# Patient Record
Sex: Female | Born: 1960 | Race: Black or African American | Hispanic: No | Marital: Married | Smoking: Never smoker
Health system: Southern US, Community
[De-identification: ages and names within clinical notes are randomized; demographics above are authoritative.]

## PROBLEM LIST (undated history)

## (undated) DIAGNOSIS — I10 Essential (primary) hypertension: Secondary | ICD-10-CM

## (undated) DIAGNOSIS — E119 Type 2 diabetes mellitus without complications: Secondary | ICD-10-CM

## (undated) HISTORY — DX: Type 2 diabetes mellitus without complications: E11.9

## (undated) HISTORY — DX: Essential (primary) hypertension: I10

## (undated) HISTORY — PX: FRACTURE SURGERY: SHX138

---

## 2009-03-26 ENCOUNTER — Ambulatory Visit: Payer: Self-pay | Admitting: Unknown Physician Specialty

## 2009-03-28 ENCOUNTER — Ambulatory Visit: Payer: Self-pay | Admitting: Unknown Physician Specialty

## 2014-08-02 ENCOUNTER — Encounter: Payer: Self-pay | Admitting: Podiatry

## 2014-08-02 ENCOUNTER — Ambulatory Visit (INDEPENDENT_AMBULATORY_CARE_PROVIDER_SITE_OTHER): Payer: BC Managed Care – PPO

## 2014-08-02 ENCOUNTER — Ambulatory Visit (INDEPENDENT_AMBULATORY_CARE_PROVIDER_SITE_OTHER): Payer: BC Managed Care – PPO | Admitting: Podiatry

## 2014-08-02 VITALS — BP 116/74 | HR 79 | Resp 12

## 2014-08-02 DIAGNOSIS — R52 Pain, unspecified: Secondary | ICD-10-CM

## 2014-08-02 DIAGNOSIS — Q6651 Congenital pes planus, right foot: Secondary | ICD-10-CM

## 2014-08-02 DIAGNOSIS — S92301A Fracture of unspecified metatarsal bone(s), right foot, initial encounter for closed fracture: Secondary | ICD-10-CM

## 2014-08-02 NOTE — Progress Notes (Signed)
   Subjective:    Patient ID: Lindsey Warren, female    DOB: 02/11/1961, 54 y.o.   MRN: [id number]  HPI  PT STATED INJURED RT FOOT LATERAL SIDE OF THE ANKLE 2 WEEKS AGO AND STILL SWOLLEN PAINFUL. RT ANKLE IS GETTING WORSE, SOMETIMES WHEN WALKING HAVE SHARP PAIN. TRIED NO TREATMENT.  Review of Systems  Constitutional: Positive for unexpected weight change.  All other systems reviewed and are negative.      Objective:   Physical Exam: I have reviewed her past medical history medications allergies surgery social history and review of systems. Pulses are strongly palpable bilateral. Neurologic sensorium is intact per Semmes-Weinstein monofilament. Deep tendon reflexes are intact bilateral and muscle strength was 5 over 5 dorsiflexion plantar flexors and inverters everters onto the musculature is intact. Orthopedic evaluation of a straight solid joints distal to the ankle for range of motion without crepitus the right foot appears to be much more flat and pronated foot type than the right. She also has pain and swelling overlying the anterolateral ankle as well as the fifth metatarsal base. Radiographic evaluation demonstrates a relatively normal ankle with severe pes planus. She does have what appears to be a stress reaction fifth metatarsal base of the right foot.        Assessment & Plan:  Assessment: Stress fracture fifth met base right.  Plan: The compression anklet for the swelling and placed her in a CAM walker and I will follow-up with her in 3-4 weeks for another set of x-rays. We may also discuss reconstruction of her right flat foot deformity.

## 2014-08-30 ENCOUNTER — Ambulatory Visit: Payer: BC Managed Care – PPO | Admitting: Podiatry

## 2014-09-04 ENCOUNTER — Encounter: Payer: Self-pay | Admitting: Podiatry

## 2014-09-04 ENCOUNTER — Ambulatory Visit (INDEPENDENT_AMBULATORY_CARE_PROVIDER_SITE_OTHER): Payer: BC Managed Care – PPO | Admitting: Podiatry

## 2014-09-04 ENCOUNTER — Ambulatory Visit (INDEPENDENT_AMBULATORY_CARE_PROVIDER_SITE_OTHER): Payer: BC Managed Care – PPO

## 2014-09-04 DIAGNOSIS — Q6651 Congenital pes planus, right foot: Secondary | ICD-10-CM | POA: Diagnosis not present

## 2014-09-04 DIAGNOSIS — S92301D Fracture of unspecified metatarsal bone(s), right foot, subsequent encounter for fracture with routine healing: Secondary | ICD-10-CM

## 2014-09-04 NOTE — Progress Notes (Signed)
She presents today for follow-up of her right fifth metatarsal fracture. She states that I still have pain over the fifth metatarsal and I have pulling of the back outside of the ankle. I would also like to talk about having my flatfoot repaired.  Objective: Vital signs are stable she is alert and oriented 3. Pulses are strongly palpable bilateral. Severe flatfoot deformity with calcaneal valgus and severe medial deviation of the talus with plantarflexion. Radiographic evaluation demonstrates severe flatfoot deformity with fracture fifth met base right. Chronic pain to the posterior lateral aspect of the foot. Full subtalar joint range of motion. When the subtalar joint is in line with the leg she has a flexible forefoot varus. Foot appears to be totally reducible. Ankle equinus is also noted and obviously it posterior tibial tendon dysfunction.  Assessment: Fracture fifth met base right non-comminuted. Severe flatfoot deformity Rule out coalition without a CT.  Plan: Request a CT of the right foot today and will discuss this with Dr. Jacqualyn Posey for surgical consideration.

## 2014-09-05 ENCOUNTER — Ambulatory Visit: Payer: Self-pay | Admitting: Podiatry

## 2014-09-08 ENCOUNTER — Encounter: Payer: Self-pay | Admitting: Podiatry

## 2014-09-12 ENCOUNTER — Ambulatory Visit (INDEPENDENT_AMBULATORY_CARE_PROVIDER_SITE_OTHER): Payer: BC Managed Care – PPO | Admitting: Podiatry

## 2014-09-12 DIAGNOSIS — R52 Pain, unspecified: Secondary | ICD-10-CM

## 2014-09-12 DIAGNOSIS — Q6651 Congenital pes planus, right foot: Secondary | ICD-10-CM

## 2014-09-14 NOTE — Progress Notes (Signed)
Subjective:     Patient ID: Lindsey Warren, female   DOB: 1960/07/20, 54 y.o.   MRN: [id number]  HPI 54 year old female presents the office today to discuss CT results of her right foot. She states that she has had pain to her right foot for several years and she is attended multiple conservative treatments including orthotics and bracing without any resolution. She states it is difficult for her to stand for prolonged periods of time and walk for prolonged distances without any discomfort. She is pretty treated for metatarsal fracture and ankle pain by Dr. Milinda Pointer. A CT scan was ordered for which she presents a discussed the results and discuss possible surgical intervention. No other complaints at this time in no acute changes since last appointment.  Review of Systems  All other systems reviewed and are negative.      Objective:   Physical Exam     Assessment:     AAO 3, NAD DP/PT pulses palpable, CRT less than 3 seconds Protective sensation intact with Simms Weinstein monofilament, vibratory sensation intact, Achilles tendon reflex intact. There is a significant decrease in medial arch height upon weightbearing with equinus present. There is transverse and frontal plane deformity as well. There is tenderness along the lateral aspect of the foot along the sinus tarsi and there is mild discomfort with subtalar joint range of motion. There is also tenderness overlying the dorsal aspect of the foot over the talonavicular joint. There is no pain or crepitation with ankle joint range of motion. There is no pain with MTPJ range of motion. There is no specific area pinpoint bony tenderness or pain with vibratory sensation. There is no overlying edema, erythema, increase in warmth. No open lesions or pre-ulcer lesions identified bilaterally. No pain with calf compression, swelling, warmth, erythema.    Plan:     54 year old female with symptomatic flatfoot deformity -CT scan results were  discussed the patient.  -I discussed the patient with conservative and surgical options. She states that this time she would proceed with surgical intervention and does not want a temporary fix. I discussed with the patient that since she has pain in the subtalar joint area and there is no findings on CT scan I recommended an injection into the area for both diagnostic and therapeutic purposes. She declined his injection. Risks versus difficult to evaluate at the actual subtalar joint is involved and they can determine surgical procedures however she declined. I discussed with the patient medial column fusion and possible and medial slide calcaneal osteotomy. I discussed the patient postoperative recovery time as well as competitions of the surgery. -She would have surgery the end of May. I recommend the patient think about her options and follow up in 2-3 weeks to schedule the surgery. In the meantime I'm going to obtain the actual copies of the CT scan to look at it in more detail to determine surgical procedure and I will discuss with Dr. Milinda Pointer.

## 2014-09-20 ENCOUNTER — Telehealth: Payer: Self-pay | Admitting: *Deleted

## 2014-09-20 NOTE — Telephone Encounter (Signed)
Pt states she was calling for the surgery date, Dr. Milinda Pointer and the other doctor had decided for her procedure.  Pt states she needs the confirmation for the employer.

## 2014-09-21 ENCOUNTER — Ambulatory Visit: Payer: BC Managed Care – PPO | Admitting: Podiatry

## 2014-09-21 NOTE — Telephone Encounter (Signed)
Jenny Reichmann called patient today. Per Dr. Jacqualyn Posey, he needs to talk to Dr Milinda Pointer first before scheduling her surgery.

## 2014-09-26 ENCOUNTER — Ambulatory Visit (INDEPENDENT_AMBULATORY_CARE_PROVIDER_SITE_OTHER): Payer: BC Managed Care – PPO | Admitting: Podiatry

## 2014-09-26 ENCOUNTER — Encounter: Payer: Self-pay | Admitting: Podiatry

## 2014-09-26 ENCOUNTER — Ambulatory Visit (INDEPENDENT_AMBULATORY_CARE_PROVIDER_SITE_OTHER): Payer: BC Managed Care – PPO

## 2014-09-26 DIAGNOSIS — M6789 Other specified disorders of synovium and tendon, multiple sites: Secondary | ICD-10-CM | POA: Diagnosis not present

## 2014-09-26 DIAGNOSIS — M19079 Primary osteoarthritis, unspecified ankle and foot: Secondary | ICD-10-CM | POA: Diagnosis not present

## 2014-09-26 DIAGNOSIS — M76829 Posterior tibial tendinitis, unspecified leg: Secondary | ICD-10-CM

## 2014-09-26 NOTE — Patient Instructions (Signed)
Pre-Operative Instructions  Congratulations, you have decided to take an important step to improving your quality of life.  You can be assured that the doctors of [facility] will be with you every step of the way.  1. Plan to be at the surgery center/hospital at least 1 (one) hour prior to your scheduled time unless otherwise directed by the surgical center/hospital staff.  You must have a responsible adult accompany you, remain during the surgery and drive you home.  Make sure you have directions to the surgical center/hospital and know how to get there on time. 2. For hospital based surgery you will need to obtain a history and physical form from your family physician within 1 month prior to the date of surgery- we will give you a form for you primary physician.  3. We make every effort to accommodate the date you request for surgery.  There are however, times where surgery dates or times have to be moved.  We will contact you as soon as possible if a change in schedule is required.   4. No Aspirin/Ibuprofen for one week before surgery.  If you are on aspirin, any non-steroidal anti-inflammatory medications (Mobic, Aleve, Ibuprofen) you should stop taking it 7 days prior to your surgery.  You make take Tylenol  For pain prior to surgery.  5. Medications- If you are taking daily heart and blood pressure medications, seizure, reflux, allergy, asthma, anxiety, pain or diabetes medications, make sure the surgery center/hospital is aware before the day of surgery so they may notify you which medications to take or avoid the day of surgery. 6. No food or drink after midnight the night before surgery unless directed otherwise by surgical center/hospital staff. 7. No alcoholic beverages 24 hours prior to surgery.  No smoking 24 hours prior to or 24 hours after surgery. 8. Wear loose pants or shorts- loose enough to fit over bandages, boots, and casts. 9. No slip on shoes, sneakers are best. 10. Bring  your boot with you to the surgery center/hospital.  Also bring crutches or a walker if your physician has prescribed it for you.  If you do not have this equipment, it will be provided for you after surgery. 11. If you have not been contracted by the surgery center/hospital by the day before your surgery, call to confirm the date and time of your surgery. 12. Leave-time from work may vary depending on the type of surgery you have.  Appropriate arrangements should be made prior to surgery with your employer. 13. Prescriptions will be provided immediately following surgery by your doctor.  Have these filled as soon as possible after surgery and take the medication as directed. 14. Remove nail polish on the operative foot. 15. Wash the night before surgery.  The night before surgery wash the foot and leg well with the antibacterial soap provided and water paying special attention to beneath the toenails and in between the toes.  Rinse thoroughly with water and dry well with a towel.  Perform this wash unless told not to do so by your physician.  Enclosed: 1 Ice pack (please put in freezer the night before surgery)   1 Hibiclens skin cleaner   Pre-op Instructions  If you have any questions regarding the instructions, do not hesitate to call our office.  [place]: [address] [place], [place] [address] [phone]  [place]: [address] [place], [place] [address] [phone]  [place]: [address]  [place],  [address] [phone]  Dr. Richard   Tuchman DPM, Dr. Norman Regal DPM Dr. Richard Sikora DPM, Dr. M. Todd Hyatt DPM, Dr. Kathryn Egerton DPM 

## 2014-09-27 ENCOUNTER — Other Ambulatory Visit: Payer: Self-pay | Admitting: Podiatry

## 2014-09-27 DIAGNOSIS — Z01818 Encounter for other preprocedural examination: Secondary | ICD-10-CM

## 2014-09-27 NOTE — Progress Notes (Signed)
Patient ID: Lindsey Warren, female   DOB: 1961/05/21, 54 y.o.   MRN: [id number]  Subjective: 54 year old female returns the office they discuss surgical options for symptom flatfoot deformity of the right foot. She states that she continues with ankle brace as needed for which she has to the majority of time. She has tried orthotics as well as shoe gear changes without any resolution. She has pain after prolonged ambulation and standing which is limiting her activities. She has no recent injury or trauma to the area. No other complaints at this time.  Objective: AAO 3, NAD DP/PT pulses palpable, CRT less than 3 seconds Protective sensation intact with Derrel Nip Monofilament, vibratory sensation intact, Achilles tendon reflex intact. There is a significant decrease in medial arch height upon weightbearing with equinus present on the right foot. Upon weightbearing the calcaneus does appear to be in slight valgus. There also does appear to be too many toe sign present and abduction of the forefoot. In gait she has prolonged pronation without any resupination. On nonweightbearing exam ankle joint range of motion and subtalar joint range of motion is intact. There is mild discomfort with subtalar joint range of motion there is no crepitation. There is tenderness overlying the dorsal aspect of the medial midfoot along the talonavicular joint. There is no specific tenderness at this time along the posterior tibial tendon on the insertion into the navicular. There is mild discomfort along the lateral aspect of the ankle on the course of the peroneal tendons however this is mild. There is no pain with eversion and the peroneals appear to be intact. MMT 5/5, ROM WNL No other areas of tenderness to bilateral lower extremities. No pain with calf compression, swelling, warmth, erythema.  Assessment: 54 year old female with symptomatic flatfoot deformity present for surgical  consultation.  Plan: -Previous x-rays and CT scans were again reviewed. The calcaneal axial view was also obtained of his appointment. -Patient this time to proceed with surgical invention. I discussed with her the proposed surgery which should be talonavicular and possible navicular cuneiform joint fusion. Also discussed her possible medial calcaneal slide osteotomy. Although the calcaneal axial view does appear to be in rectus alignment clinically she is in valgus. Also discussed her gastroc recession. I discussed with her the incision placement as well as the postoperative course in detail. I discussed with her this is major surgery to her foot and can take a long time to recover after surgery. I discussed the risks of surgery which include, but not limited to, infection, bleeding, swelling, chronic pain, need for further surgery, painful or ugly scar, numbness or sensation changes, over/under correction, reoccurrence, hardware failure, DVT/PE, loss of foot/leg, need for further joint fusions. Patient understands these risks and wishes to proceed with surgery. The surgical consent was reviewed with the patient not 3 pages were signed. No promises or guarantees were given the fact, the procedure and all questions were answered to the best of my ability. -I discussed with the patient a postoperative she will likely needs to be on DVT prophylaxis due to long-term nonweightbearing. -Prior to surgery will obtain CBC, BMP -Surgery will be performed of the [facility] specialty surgical center on May 27. -Prior to surgery patient was encouraged to call the office with any questions, concerns, change in symptoms.

## 2014-10-23 ENCOUNTER — Other Ambulatory Visit: Payer: Self-pay | Admitting: Podiatry

## 2014-10-23 LAB — CBC WITH DIFFERENTIAL/PLATELET
Basophils Absolute: 0 10*3/uL (ref 0.0–0.2)
Basos: 0 %
EOS (ABSOLUTE): 0 10*3/uL (ref 0.0–0.4)
Eos: 0 %
Hematocrit: 38.3 % (ref 34.0–46.6)
Hemoglobin: 13.4 g/dL (ref 11.1–15.9)
IMMATURE GRANS (ABS): 0 10*3/uL (ref 0.0–0.1)
IMMATURE GRANULOCYTES: 0 %
Lymphocytes Absolute: 1.8 10*3/uL (ref 0.7–3.1)
Lymphs: 35 %
MCH: 27.3 pg (ref 26.6–33.0)
MCHC: 35 g/dL (ref 31.5–35.7)
MCV: 78 fL — ABNORMAL LOW (ref 79–97)
MONOCYTES: 9 %
Monocytes Absolute: 0.4 10*3/uL (ref 0.1–0.9)
NEUTROS PCT: 56 %
Neutrophils Absolute: 2.8 10*3/uL (ref 1.4–7.0)
Platelets: 200 10*3/uL (ref 150–379)
RBC: 4.9 x10E6/uL (ref 3.77–5.28)
RDW: 13.2 % (ref 12.3–15.4)
WBC: 5.1 10*3/uL (ref 3.4–10.8)

## 2014-10-24 LAB — BMP8+EGFR
BUN/Creatinine Ratio: 20 (ref 9–23)
BUN: 15 mg/dL (ref 6–24)
CO2: 26 mmol/L (ref 18–29)
Calcium: 10 mg/dL (ref 8.7–10.2)
Chloride: 100 mmol/L (ref 97–108)
Creatinine, Ser: 0.75 mg/dL (ref 0.57–1.00)
GFR calc Af Amer: 105 mL/min/1.73 (ref >59)
GFR calc non Af Amer: 91 mL/min/1.73 (ref >59)
Glucose: 98 mg/dL (ref 65–99)
Potassium: 3.8 mmol/L (ref 3.5–5.2)
Sodium: 142 mmol/L (ref 134–144)

## 2014-11-08 ENCOUNTER — Telehealth: Payer: Self-pay | Admitting: *Deleted

## 2014-11-08 NOTE — Telephone Encounter (Signed)
"  Supposed to have foot surgery next Friday.  Is there anything I need to do?  I registered online and I had my bloodwork.  When will they call me to tell me what time to be there for surgery?"    I left her a message that there is nothing else to do other than remember not to eat or drink anything after midnight the night before surgery.  Someone will need to go with you because you will be under a local / IV sedation not able to drive.  The surgical center normally calls a day or 2 prior to surgery.  They will tell you the exact time to be there.  Please call if you have any further questions.

## 2014-11-17 DIAGNOSIS — M13879 Other specified arthritis, unspecified ankle and foot: Secondary | ICD-10-CM | POA: Diagnosis not present

## 2014-11-17 DIAGNOSIS — Q664 Congenital talipes calcaneovalgus: Secondary | ICD-10-CM | POA: Diagnosis not present

## 2014-11-17 DIAGNOSIS — M216X9 Other acquired deformities of unspecified foot: Secondary | ICD-10-CM | POA: Diagnosis not present

## 2014-11-22 ENCOUNTER — Ambulatory Visit (INDEPENDENT_AMBULATORY_CARE_PROVIDER_SITE_OTHER): Payer: BC Managed Care – PPO | Admitting: Podiatry

## 2014-11-22 ENCOUNTER — Ambulatory Visit (INDEPENDENT_AMBULATORY_CARE_PROVIDER_SITE_OTHER): Payer: BC Managed Care – PPO

## 2014-11-22 ENCOUNTER — Encounter: Payer: Self-pay | Admitting: Podiatry

## 2014-11-22 VITALS — BP 118/75 | HR 94 | Temp 98.2°F | Resp 16

## 2014-11-22 DIAGNOSIS — M19071 Primary osteoarthritis, right ankle and foot: Secondary | ICD-10-CM

## 2014-11-22 DIAGNOSIS — Z9889 Other specified postprocedural states: Secondary | ICD-10-CM

## 2014-11-22 NOTE — Progress Notes (Signed)
She presents today for follow-up of her flat foot repair right. Date of surgery 11/17/2014. Gastrocnemius muscle recession talonavicular fusion and a calcaneal slide osteotomy. She denies fever chills nausea vomiting muscle aches and pains. Continues Lovenox injections daily.  Objective: Vital signs are stable she's alert and oriented 3 presents on a knee scooter with cast of the right lower extremity which does not appear to be dirty. There is no malodor about the cast. Vital signs are stable she's alert and oriented 3. She has good range of motion of her toes which are sensitive to the touch. Radiographs confirm calcaneal osteotomy as well as talonavicular joint fusion. Screws remain in good position.  Assessment: Flatfoot repair right. No complications.  Plan: She will continue to be nonweightbearing with a knee scooter. She will keep the foot elevated and continue ice therapy. She will also continue her Lovenox therapy. She will follow up with Dr. Earleen Newport in 1 week. I have scheduled her for cast removal and application with staple removal.

## 2014-11-30 ENCOUNTER — Encounter: Payer: Self-pay | Admitting: Podiatry

## 2014-11-30 ENCOUNTER — Ambulatory Visit (INDEPENDENT_AMBULATORY_CARE_PROVIDER_SITE_OTHER): Payer: BC Managed Care – PPO | Admitting: Podiatry

## 2014-11-30 VITALS — BP 119/76 | HR 91 | Temp 98.0°F | Resp 16

## 2014-11-30 DIAGNOSIS — Z9889 Other specified postprocedural states: Secondary | ICD-10-CM

## 2014-11-30 DIAGNOSIS — B351 Tinea unguium: Secondary | ICD-10-CM

## 2014-11-30 MED ORDER — OXYCODONE-ACETAMINOPHEN 5-325 MG PO TABS: 1.0000 | ORAL_TABLET | Freq: Four times a day (QID) | ORAL | Status: DC | PRN

## 2014-12-04 NOTE — Progress Notes (Signed)
Patient ID: Lindsey Warren, female   DOB: [birth year], 54 y.o.   MRN: [id number]  DOS: 11/17/14 s/p Right gastroc recession, medial calcaneal slide osteotomy, talo-navicular joint arthrodesis;  Objective: 54 year old female presents the office today two-week status post right foot surgery. She states overall she is doing well and her pain is controlled. She's remaining nonweightbearing as directed with the use of a rolling knee scooter. She has been injecting Lovenox daily without any complications. She has no complaints this time. Denies any systemic complaints as fevers, chills, nausea, vomiting. Denies any calf pain, chest pain, soreness of breath. No other complaints at this time no acute changes since last appointment.  Objective: AAO 3, NAD DP/PT pulses palpable, CRT less than 3 seconds Protective sensation intact with Simms 1 2 monofilament Incision is on the medial mid calf, lateral calcaneus, dorsal medial midfoot, posterior heel are all well coapted without any evidence of dehiscence and sutures/staples are intact. There is no swelling erythema, ascending cellulitis, fluctuance, crepitus, drainage, purulence, malodor. There is mild edema around the surgical site. There is no significant tenderness to palpation along the surgical site at this time. No other areas of tenderness to bilateral lower extremities. No other open lesions or pre-ulcer lesions identified bilaterally. No pain with calf compression, swelling, warmth, erythema.  Assessment: 54 year old female 2 weeks status post right foot surgery, doing well  Plan: -Treatment options discussed including all alternatives, risks, and complications -Cast is removed. Sutures/staples removed without complications and the incisions remain coapted. Steri-Strips are placed over the incisions. Antibiotic when his place of the incisions followed by dry sterile dressing. Next the well-padded below-knee fiberglass cast was then applied making  sure to pad all bony prominences. -Continue nonweightbearing -Lovenox daily -Pain medication as needed -Ice to the area, continue elevation -Monitor for any signs or symptoms of infection and DVT/PE and directed to call the office immediately should any occur or go to the ER. -Follow up in 2 weeks for cast change or sooner if any problems are to arise. In the meantime I encouraged her to call the office with any questions, concerns, change in symptoms.

## 2014-12-12 ENCOUNTER — Ambulatory Visit (INDEPENDENT_AMBULATORY_CARE_PROVIDER_SITE_OTHER): Payer: BC Managed Care – PPO | Admitting: Podiatry

## 2014-12-12 VITALS — BP 110/75 | HR 90 | Resp 16

## 2014-12-12 DIAGNOSIS — Z9889 Other specified postprocedural states: Secondary | ICD-10-CM

## 2014-12-18 NOTE — Progress Notes (Signed)
Patient ID: Lindsey Warren, female   DOB: Dec 02, 1960, 54 y.o.   MRN: [id number]  DOS: 11/17/14 s/p Right gastroc recession, medial calcaneal slide osteotomy, talo-navicular joint arthrodesis;  Objective: [name] presents the office today 4-weeks status post right foot surgery for cast change. She states overall she is doing well and her pain is controlled. She's remaining nonweightbearing as directed with the use of a rolling knee scooter. She has been injecting Lovenox daily without any complications. She has no complaints this time. Denies any systemic complaints as fevers, chills, nausea, vomiting. Denies any calf pain, chest pain, soreness of breath. No other complaints at this time no acute changes since last appointment.  Objective: AAO 3, NAD DP/PT pulses palpable, CRT less than 3 seconds Protective sensation intact with Simms Weinstein monofilament Incision is on the medial mid calf, lateral calcaneus, dorsal medial midfoot, posterior heel are all well coapted without any evidence of dehiscence. There is no surrounding erythema, ascending cellulitis, fluctuance, crepitus, drainage, purulence, malodor. There is minimal edema around the surgical site. There is no significant tenderness to palpation along the surgical site at this time. No other areas of tenderness to bilateral lower extremities. No other open lesions or pre-ulcer lesions identified bilaterally. No pain with calf compression, swelling, warmth, erythema.  Assessment: 54 year old female 4 weeks status post right foot surgery, doing well  Plan: -Treatment options discussed including all alternatives, risks, and complications -Cast is removed.Antibiotic when his place of the incisions followed by dry sterile dressing. Next the well-padded below-knee fiberglass cast was then applied making sure to pad all bony prominences. -Continue nonweightbearing -Lovenox daily -Pain medication as needed -Ice to the area, continue  elevation -Monitor for any signs or symptoms of infection and DVT/PE and directed to call the office immediately should any occur or go to the ER. -Follow up in 2 weeks for cast change or sooner if any problems are to arise. In the meantime I encouraged her to call the office with any questions, concerns, change in symptoms. *X-ray next appointment.  Celesta Gentile, DPM

## 2015-01-02 ENCOUNTER — Ambulatory Visit (INDEPENDENT_AMBULATORY_CARE_PROVIDER_SITE_OTHER): Payer: BC Managed Care – PPO

## 2015-01-02 ENCOUNTER — Ambulatory Visit (INDEPENDENT_AMBULATORY_CARE_PROVIDER_SITE_OTHER): Payer: BC Managed Care – PPO | Admitting: Podiatry

## 2015-01-02 ENCOUNTER — Telehealth: Payer: Self-pay | Admitting: *Deleted

## 2015-01-02 VITALS — BP 123/87 | HR 88 | Resp 16

## 2015-01-02 DIAGNOSIS — Z9889 Other specified postprocedural states: Secondary | ICD-10-CM | POA: Diagnosis not present

## 2015-01-02 DIAGNOSIS — Z981 Arthrodesis status: Secondary | ICD-10-CM

## 2015-01-02 MED ORDER — OXYCODONE-ACETAMINOPHEN 5-325 MG PO TABS: 1.0000 | ORAL_TABLET | Freq: Four times a day (QID) | ORAL | Status: DC | PRN

## 2015-01-02 NOTE — Telephone Encounter (Signed)
Ok to fill per dr Jacqualyn Posey

## 2015-01-02 NOTE — Telephone Encounter (Signed)
Pt states she was seen today by Dr. Jacqualyn Posey as was to get a pain medication, please call in per pt.

## 2015-01-04 NOTE — Progress Notes (Signed)
Patient ID: Lindsey Warren, female   DOB: Oct 28, 1960, 54 y.o.   MRN: [id number]  DOS: 11/17/14 s/p Right gastroc recession, medial calcaneal slide osteotomy, talo-navicular joint arthrodesis;  Objective: Lindsey Warren presents the office today 6.5-weeks status post right foot surgery. She states overall she is doing well and her pain is controlled. She's remaining nonweightbearing as directed with the use of a rolling knee scooter. She has been injecting Lovenox daily without any complications. She has no complaints this time. Denies any systemic complaints as fevers, chills, nausea, vomiting. Denies any calf pain, chest pain, soreness of breath. No other complaints at this time no acute changes since last appointment.  Objective: AAO 3, NAD DP/PT pulses palpable, CRT less than 3 seconds Protective sensation intact with Simms Weinstein monofilament Cast clean, dry, intact. Incisions on the medial mid calf, lateral calcaneus, dorsal medial midfoot, posterior heel are all well coapted without any evidence of dehiscence. There is no surrounding erythema, ascending cellulitis, fluctuance, crepitus, drainage, purulence, malodor. There is minimal edema around the surgical site. There is no significant tenderness to palpation along the surgical sites at this time. The ankle feels "stiff" when trying to move it.  No other areas of tenderness to bilateral lower extremities. No other open lesions or pre-ulcer lesions identified bilaterally. No pain with calf compression, swelling, warmth, erythema.  Assessment: 54 year old female 6.5 weeks status post right foot surgery, doing well  Plan: -X-rays were obtained and reviewed with the patient.  -Treatment options discussed including all alternatives, risks, and complications -Cast is removed.Antibiotic when his place of the incisions followed by dry sterile dressing.  -At this time we will transition to a Cam Walker. However she should remain  nonweightbearing at all times. She can take the boot off to shower have her she should not apply any weight to her foot. She can also start gentle range of motion activity to the ankle. Discussed with her how to do this. -Lovenox daily -Pain medication as needed -Ice to the area, continue elevation -Monitor for any signs or symptoms of infection and DVT/PE and directed to call the office immediately should any occur or go to the ER. -Follow up in 3 weeks or sooner if any problems are to arise. In the meantime I encouraged her to call the office with any questions, concerns, change in symptoms. *X-ray next appointment.  Celesta Gentile, DPM

## 2015-01-23 ENCOUNTER — Ambulatory Visit (INDEPENDENT_AMBULATORY_CARE_PROVIDER_SITE_OTHER): Payer: BC Managed Care – PPO | Admitting: Podiatry

## 2015-01-23 ENCOUNTER — Ambulatory Visit (INDEPENDENT_AMBULATORY_CARE_PROVIDER_SITE_OTHER): Payer: BC Managed Care – PPO

## 2015-01-23 VITALS — BP 116/70 | HR 90 | Resp 16

## 2015-01-23 DIAGNOSIS — Z981 Arthrodesis status: Secondary | ICD-10-CM | POA: Diagnosis not present

## 2015-01-23 MED ORDER — OXYCODONE-ACETAMINOPHEN 5-325 MG PO TABS: 1.0000 | ORAL_TABLET | Freq: Four times a day (QID) | ORAL | Status: DC | PRN

## 2015-01-23 NOTE — Progress Notes (Signed)
Patient ID: Lindsey Warren, female   DOB: [birth year], 54 y.o.   MRN: [id number]  DOS: 11/17/14 s/p Right gastroc recession, medial calcaneal slide osteotomy, talo-navicular joint arthrodesis;  Objective: Lindsey Warren presents the office today 9-weeks status post right foot surgery. She states overall she is doing well and her pain is controlled. She states some pain medication intermittently at night if needed. She is asking for refill take if needed. She continues to be these the rolling knee scooter. Denies any systemic complaints as fevers, chills, nausea, vomiting. Denies any calf pain, chest pain, soreness of breath. No other complaints at this time no acute changes since last appointment.  Objective: AAO 3, NAD; presents in a CAM boot DP/PT pulses palpable, CRT less than 3 seconds Protective sensation intact with Simms Weinstein monofilament Incisions on the medial mid calf, lateral calcaneus, dorsal medial midfoot, posterior heel are all well coapted without any evidence of dehiscence and eschar has formed. There is no surrounding erythema, ascending cellulitis, fluctuance, crepitus, drainage, purulence, malodor. There is minimal edema around the surgical site. There is no tenderness to palpation along the surgical sites at this time.  No other areas of tenderness to bilateral lower extremities. No other open lesions or pre-ulcer lesions identified bilaterally. No pain with calf compression, swelling, warmth, erythema.  Assessment: 54 year old female 9 weeks status post right foot surgery, doing well  Plan: -X-rays were obtained and reviewed with the patient. Radial is in line still evident across the arthrodesis site. Hardware intact.  -Treatment options discussed including all alternatives, risks, and complications -She can start to put partial weightbearing on the right foot along she states the cam walker. Continue use of crutches. If there is any increase in pain returned and  nonweightbearing. -Pain medication as needed; refilled Percocet -Ice to the area, continue elevation -Monitor for any signs or symptoms of infection and DVT/PE and directed to call the office immediately should any occur or go to the ER. -Follow up in 4 weeks or sooner if any problems are to arise. In the meantime I encouraged her to call the office with any questions, concerns, change in symptoms. *If the next appointment there is not complete healing on x-ray we'll likely order bone submitted. *X-ray next appointment.  Celesta Gentile, DPM

## 2015-01-29 DIAGNOSIS — M79673 Pain in unspecified foot: Secondary | ICD-10-CM

## 2015-02-20 ENCOUNTER — Ambulatory Visit (INDEPENDENT_AMBULATORY_CARE_PROVIDER_SITE_OTHER): Payer: BC Managed Care – PPO | Admitting: Podiatry

## 2015-02-20 ENCOUNTER — Encounter: Payer: Self-pay | Admitting: Podiatry

## 2015-02-20 ENCOUNTER — Ambulatory Visit (INDEPENDENT_AMBULATORY_CARE_PROVIDER_SITE_OTHER): Payer: BC Managed Care – PPO

## 2015-02-20 VITALS — BP 106/67 | HR 71 | Resp 17

## 2015-02-20 DIAGNOSIS — S92909K Unspecified fracture of unspecified foot, subsequent encounter for fracture with nonunion: Secondary | ICD-10-CM

## 2015-02-20 DIAGNOSIS — Z981 Arthrodesis status: Secondary | ICD-10-CM

## 2015-02-20 DIAGNOSIS — S92901K Unspecified fracture of right foot, subsequent encounter for fracture with nonunion: Secondary | ICD-10-CM | POA: Diagnosis not present

## 2015-02-21 DIAGNOSIS — M79673 Pain in unspecified foot: Secondary | ICD-10-CM

## 2015-02-22 NOTE — Progress Notes (Addendum)
Patient ID: Lindsey Warren, female   DOB: 1961-04-28, 54 y.o.   MRN: [id number]  DOS: 11/17/14 s/p Right gastroc recession, medial calcaneal slide osteotomy, talo-navicular joint arthrodesis  Objective: Lindsey Warren presents the office today status post right foot surgery. She states overall she is doing well and her pain is controlled. She gets some occasional discomfort after being on her feet. She has started to put some weight down to her foot however she still uses the crutches for assistance and not painful weight. She is going to cam boot at all times. Denies any systemic complaints as fevers, chills, nausea, vomiting. Denies any calf pain, chest pain, soreness of breath. No other complaints at this time no acute changes since last appointment.  Objective: AAO 3, NAD; presents in a CAM boot DP/PT pulses palpable, CRT less than 3 seconds Protective sensation intact with Simms Weinstein monofilament Incisions on the medial mid calf, lateral calcaneus, dorsal medial midfoot, posterior heel are all well coapted and the scar is well formed. There is no surrounding erythema, ascending cellulitis, fluctuance, crepitus, drainage, purulence, malodor. There is trace edema around the surgical site. There there is no specific tenderness however there is some "discomfort" over the talonavicular surgical site. No other areas of tenderness to bilateral lower extremities. No other open lesions or pre-ulcer lesions identified bilaterally. No pain with calf compression, swelling, warmth, erythema.  Assessment: 54 year old female status post right foot surgery with nonunion.  Plan: -X-rays were obtained and reviewed with the patient. A radiolucent line is still evident at this time across the arthrodesis site. Fracture/arthrodesis gap 1-12mm. -Treatment options discussed including all alternatives, risks, and complications -She can start toweight-bear as tolerated in a CAM boot. Hopefully by putting some  pressure the foot will help to stimulate bone growth. -Ordered bone stimulator.  -Pain medication as needed -Ice to the area, continue elevation -Monitor for any signs or symptoms of infection and DVT/PE and directed to call the office immediately should any occur or go to the ER. -Follow up in 4 weeks or sooner if any problems are to arise. In the meantime I encouraged her to call the office with any questions, concerns, change in symptoms. *X-ray next appointment.  Celesta Gentile, DPM

## 2015-02-28 ENCOUNTER — Telehealth: Payer: Self-pay | Admitting: *Deleted

## 2015-02-28 NOTE — Telephone Encounter (Signed)
Pt states she left a form 1 week ago for Dr.Wagoner to complete and she was checking to see if it was ready, it needs to be returned before 03/02/2015/

## 2015-03-08 ENCOUNTER — Encounter: Payer: Self-pay | Admitting: Podiatry

## 2015-03-08 ENCOUNTER — Ambulatory Visit (INDEPENDENT_AMBULATORY_CARE_PROVIDER_SITE_OTHER): Payer: BC Managed Care – PPO

## 2015-03-08 ENCOUNTER — Telehealth: Payer: Self-pay | Admitting: *Deleted

## 2015-03-08 ENCOUNTER — Ambulatory Visit (INDEPENDENT_AMBULATORY_CARE_PROVIDER_SITE_OTHER): Payer: BC Managed Care – PPO | Admitting: Podiatry

## 2015-03-08 VITALS — BP 107/61 | HR 84 | Resp 18

## 2015-03-08 DIAGNOSIS — Z981 Arthrodesis status: Secondary | ICD-10-CM

## 2015-03-08 DIAGNOSIS — S92909K Unspecified fracture of unspecified foot, subsequent encounter for fracture with nonunion: Secondary | ICD-10-CM

## 2015-03-08 NOTE — Telephone Encounter (Signed)
I called the patient 03-06-15 and left a message that the disability paper was done and that we were closed on Friday and the patient is here in the office today and I asked patient if she got the call and patient stated that she did and would just pick the paperwork up when she came in and now has another form to fill out to take back to work. Lindsey Warren

## 2015-03-08 NOTE — Progress Notes (Signed)
Patient ID: Lindsey Warren, female   DOB: [birth year], 54 y.o.   MRN: [id number]  DOS: 11/17/14 s/p Right gastroc recession, medial calcaneal slide osteotomy, talo-navicular joint arthrodesis  Objective: [name] presents the office today status post right foot surgery. She states overall she is doing well and her pain is controlled. She has not been taking pain medicine. She has been ambulating in her cam boot and only minimal intermittent discomfort. She denies any increase in swelling or any redness. Denies any recent injury or trauma. She did the bone stim unit approximately one week ago and his been using that. No other complaints at this time in no acute changes. Denies any systemic complaints such as fevers, chills, nausea, vomiting. No calf pain, chest pain, shortness of breath.  Objective: AAO 3, NAD; presents in a CAM boot DP/PT pulses palpable, CRT less than 3 seconds Protective sensation intact with Simms Weinstein monofilament Incisions on the medial mid calf, lateral calcaneus, dorsal medial midfoot, posterior heel are all well coapted and the scar is well formed. There is no surrounding erythema, ascending cellulitis, fluctuance, crepitus, drainage, purulence, malodor. There is trace edema around the surgical site. There there is no tenderness  over the surgical sites.  Evaluation of the rear foot appears to be more rectus. There is an increase in medial arch height compared to preoperatively. No other areas of tenderness to bilateral lower extremities. No other open lesions or pre-ulcer lesions identified bilaterally. No pain with calf compression, swelling, warmth, erythema.  Assessment: 54 year old female status post right foot surgery with nonunion; with minimal discomfort.  Plan: -X-rays were obtained and reviewed with the patient.  -Treatment options discussed including all alternatives, risks, and complications -She can start to transition back into a regular shoe with  the use of an ankle brace which was dispensed to her today. -Continue bone submitted. -Pain medication as needed -Ice to the area, continue elevation -Monitor for any signs or symptoms of infection and DVT/PE and directed to call the office immediately should any occur or go to the ER. -Follow up in 4 weeks or sooner if any problems are to arise. In the meantime I encouraged her to call the office with any questions, concerns, change in symptoms. *X-ray next appointment.  Celesta Gentile, DPM

## 2015-03-15 ENCOUNTER — Encounter: Payer: Self-pay | Admitting: Podiatry

## 2015-03-15 DIAGNOSIS — M79673 Pain in unspecified foot: Secondary | ICD-10-CM

## 2015-03-26 DIAGNOSIS — M79673 Pain in unspecified foot: Secondary | ICD-10-CM

## 2015-04-05 ENCOUNTER — Encounter: Payer: Self-pay | Admitting: Podiatry

## 2015-04-05 ENCOUNTER — Ambulatory Visit (INDEPENDENT_AMBULATORY_CARE_PROVIDER_SITE_OTHER): Payer: BC Managed Care – PPO | Admitting: Podiatry

## 2015-04-05 ENCOUNTER — Ambulatory Visit (INDEPENDENT_AMBULATORY_CARE_PROVIDER_SITE_OTHER): Payer: BC Managed Care – PPO

## 2015-04-05 VITALS — BP 104/70 | HR 70 | Resp 18

## 2015-04-05 DIAGNOSIS — S92909K Unspecified fracture of unspecified foot, subsequent encounter for fracture with nonunion: Secondary | ICD-10-CM

## 2015-04-05 DIAGNOSIS — Z981 Arthrodesis status: Secondary | ICD-10-CM

## 2015-04-05 DIAGNOSIS — M19079 Primary osteoarthritis, unspecified ankle and foot: Secondary | ICD-10-CM | POA: Diagnosis not present

## 2015-04-06 ENCOUNTER — Telehealth: Payer: Self-pay | Admitting: *Deleted

## 2015-04-06 DIAGNOSIS — Z981 Arthrodesis status: Secondary | ICD-10-CM

## 2015-04-06 NOTE — Telephone Encounter (Addendum)
Pt states Dr. Jacqualyn Posey had discussed PT for her foot, and she has decided to accept.  Informed pt of Dr. Leigh Aurora orders and pt states she would like to remain in the [place] area and go to [facility].  Faxed PT orders to [phone].

## 2015-04-07 NOTE — Progress Notes (Signed)
Patient ID: Lindsey Warren, female   DOB: Aug 07, 1960, 54 y.o.   MRN: [id number]  DOS: 11/17/14 s/p Right gastroc recession, medial calcaneal slide osteotomy, talo-navicular joint arthrodesis  Objective: Lindsey Warren presents the office today status post right foot surgery. She states that she is doing well and she is slowly getting better. She states that she does feel that her pain is much improved compared to what it was prior to surgery however the pain that she's expansion now is different pain and is likely more due to the surgery recovering. She states that she is able to wear a regular shoe with an ankle brace and she does perform her daily activities as she was doing prior to surgery. She has not yet increased her activity to go back to walking/exercising but she can perform daily activities without any problems. She is also working at this time. She continues to use the bone stimulator. No other complaints at this time in no acute changes. Denies any systemic complaints such as fevers, chills, nausea, vomiting. No calf pain, chest pain, shortness of breath.  Objective: AAO 3, NAD; presents in a CAM boot DP/PT pulses palpable, CRT less than 3 seconds Protective sensation intact with Simms Weinstein monofilament Incisions on the medial mid calf, lateral calcaneus, dorsal medial midfoot, posterior heel are all well coapted and the scar is well formed. There is no surrounding erythema, ascending cellulitis, fluctuance, crepitus, drainage, purulence, malodor. There is trace edema around the surgical site. There is no specific tenderness to palpation along the surgical site at the bilateral feet.  No other areas of tenderness to bilateral lower extremities. No other open lesions or pre-ulcer lesions identified bilaterally. No pain with calf compression, swelling, warmth, erythema.  Assessment: 54 year old female status post right foot surgery  with improving symptoms.  Plan: -X-rays were  obtained and reviewed with the patient.  There does appear to be increased consolidation across the arthrodesis site. -Treatment options discussed including all alternatives, risks, and complications -Continue with regular supportive shoe gear. She can continue the ankle brace if needed. -Continue bone  stimulator -Pain medication as needed -Ice to the area, continue elevation -I discussed physical therapy however she wishes to hold off on this time. -Monitor for any signs or symptoms of infection and DVT/PE and directed to call the office immediately should any occur or go to the ER. -Follow up in 6 weeks or sooner if any problems are to arise. In the meantime I encouraged her to call the office with any questions, concerns, change in symptoms. *X-ray next appointment.  Celesta Gentile, DPM

## 2015-04-07 NOTE — Telephone Encounter (Signed)
If she lives in [place] she can do [facility] or if she can come to [place] then [facility]. We can mail her a prescription and she will need to call to make that appointment.

## 2015-05-15 ENCOUNTER — Encounter: Payer: BC Managed Care – PPO | Admitting: Podiatry

## 2015-05-22 ENCOUNTER — Ambulatory Visit (INDEPENDENT_AMBULATORY_CARE_PROVIDER_SITE_OTHER): Payer: BC Managed Care – PPO | Admitting: Podiatry

## 2015-05-22 ENCOUNTER — Encounter: Payer: Self-pay | Admitting: Podiatry

## 2015-05-22 ENCOUNTER — Ambulatory Visit (INDEPENDENT_AMBULATORY_CARE_PROVIDER_SITE_OTHER): Payer: BC Managed Care – PPO

## 2015-05-22 VITALS — BP 97/68 | HR 77 | Resp 18

## 2015-05-22 DIAGNOSIS — Z981 Arthrodesis status: Secondary | ICD-10-CM

## 2015-05-22 NOTE — Progress Notes (Signed)
Patient ID: Lindsey Warren, female   DOB: April 24, 1961, 54 y.o.   MRN: [id number]  DOS: 11/17/14 s/p Right gastroc recession, medial calcaneal slide osteotomy, talo-navicular joint arthrodesis  Objective: Lindsey Warren presents the office today status post right foot surgery. She states that she is doing well and she is continues to get better.  She states that she has been limited physical therapy which is helped. She does his pain is much improved compared to what was prior to surgery. The discomfort that she's experiencing now she states is different. She states that she has more stiffness in the morning when she first gets up but she gets going the results. She states over the last week she has been able to get them without any stiffness and she continues to improve. She denies any pain to the area but describes is that this is somewhat uncomfortable. She is back to work and doing daily activities. She does walk with a limp some this continues to improve. She is also continue bone similar.  No other complaints at this time in no acute changes. Denies any systemic complaints such as fevers, chills, nausea, vomiting. No calf pain, chest pain, shortness of breath.  Objective: AAO 3, NAD; presents in a CAM boot DP/PT pulses palpable, CRT less than 3 seconds Protective sensation intact with Simms Weinstein monofilament Incisions on the medial mid calf, lateral calcaneus, dorsal medial midfoot, posterior heel are all well coapted and the scar is well formed. There is no surrounding erythema, ascending cellulitis, fluctuance, crepitus, drainage, purulence, malodor. There is faint edema around the surgical site. There is no specific tenderness to palpation along the surgical site at the bilateral feet. Subtalar joint motion is limited.  No other areas of tenderness to bilateral lower extremities. No other open lesions or pre-ulcer lesions identified bilaterally. No pain with calf compression, swelling,  warmth, erythema.  Assessment: 54 year old female status post right foot surgery  with improving symptoms.  Plan: -X-rays were obtained and reviewed with the patient.  There does appear to be increased consolidation across the arthrodesis site. -Treatment options discussed including all alternatives, risks, and complications -Continue with regular supportive shoe gear. She can continue the ankle brace if needed bur she states she has been able to go without it.  -Continue bone  stimulator -Pain medication as needed -Ice to the area, continue elevation -Discussed orthotics. She was scanned for orthotics however before proceeding with him we will check with insurance completely. -Continue physical therapy. -Monitor for any signs or symptoms of infection and DVT/PE and directed to call the office immediately should any occur or go to the ER. -Follow up in 4 weeks or sooner if any problems are to arise. In the meantime I encouraged her to call the office with any questions, concerns, change in symptoms. *X-ray next appointment.  Celesta Gentile, DPM

## 2015-06-19 ENCOUNTER — Ambulatory Visit (INDEPENDENT_AMBULATORY_CARE_PROVIDER_SITE_OTHER): Payer: BC Managed Care – PPO

## 2015-06-19 ENCOUNTER — Ambulatory Visit (INDEPENDENT_AMBULATORY_CARE_PROVIDER_SITE_OTHER): Payer: BC Managed Care – PPO | Admitting: Podiatry

## 2015-06-19 ENCOUNTER — Encounter: Payer: Self-pay | Admitting: Podiatry

## 2015-06-19 VITALS — BP 106/66 | HR 76 | Resp 18

## 2015-06-19 DIAGNOSIS — Q669 Congenital deformity of feet, unspecified, unspecified foot: Secondary | ICD-10-CM

## 2015-06-19 DIAGNOSIS — M79671 Pain in right foot: Secondary | ICD-10-CM | POA: Diagnosis not present

## 2015-06-19 DIAGNOSIS — M779 Enthesopathy, unspecified: Secondary | ICD-10-CM

## 2015-06-19 DIAGNOSIS — Z981 Arthrodesis status: Secondary | ICD-10-CM

## 2015-06-19 NOTE — Progress Notes (Signed)
Patient ID: Lindsey Warren, female   DOB: June 27, 1960, 54 y.o.   MRN: [id number]  DOS: 11/17/14 s/p Right gastroc recession, medial calcaneal slide osteotomy, talo-navicular joint arthrodesis  Objective: Lindsey Warren presents the office today status post right foot surgery. She states that she feels that she is continuing to make progressive improvement. She says the pain that she was having prior to surgery has resolved of both the pain that she's experienced now as pain since the surgery. She states that she feels like she is getting cramping sensation to her foot particular the morning when she first gets up. She also would like to proceed with orthotics this time. Denies any recent injury or trauma. No other complaints at this time. Denies any systemic complaints such as fevers, chills, nausea, vomiting. No calf pain, chest pain, shortness of breath.  Objective: AAO 3, NAD; presents in a CAM boot DP/PT pulses palpable, CRT less than 3 seconds Protective sensation intact with Simms Weinstein monofilament Incisions on the medial mid calf, lateral calcaneus, dorsal medial midfoot, posterior heel are all well coapted and the scar is well formed. There is no surrounding erythema, ascending cellulitis, fluctuance, crepitus, drainage, purulence, malodor. There is faint edema around the surgical site. There is no specific tenderness to palpation along the surgical site at the bilateral feet at this time. Subtalar joint motion is limited. Subjectively she states the majority the pain in the morning when she first gets up after prolonged periods of activity. I was unable to elicit a specific area of tenderness at today's appointment. No other areas of tenderness to bilateral lower extremities. No other open lesions or pre-ulcer lesions identified bilaterally. No pain with calf compression, swelling, warmth, erythema.  Assessment: 54 year old female status post right foot surgery with intermittent foot  pain  Plan: -X-rays were obtained and reviewed with the patient.  There does appear to be increased consolidation across the arthrodesis site. -Treatment options discussed including all alternatives, risks, and complications -Night splint was dispensed. She does have some mild equinus residual that she's having a tightness sensation in her foot. This will help stretch. -She also did proceed with orthotics today. She was scanned for orthotics are sent to [facility] labs. -She can discontinue bone stimulator at this point. There is no pain along the surgical site of the arthrodesis. -Monitor for any signs or symptoms of infection and DVT/PE and directed to call the office immediately should any occur or go to the ER. -Follow up in 3 weeks to [facility] or sooner if any problems are to arise. In the meantime I encouraged her to call the office with any questions, concerns, change in symptoms.  Celesta Gentile, DPM

## 2015-06-26 ENCOUNTER — Telehealth: Payer: Self-pay | Admitting: *Deleted

## 2015-06-26 MED ORDER — TRAMADOL HCL 50 MG PO TABS: 50.0000 mg | ORAL_TABLET | Freq: Three times a day (TID) | ORAL | Status: DC | PRN

## 2015-06-26 NOTE — Telephone Encounter (Addendum)
Pt states she feels the weather is beginning to cause her foot to hurt at night and would like a pain medication.  Dr. Jacqualyn Posey ordered Tramadol 50mg , orders called to [facility] and pt.

## 2015-06-26 NOTE — Telephone Encounter (Signed)
Lets try tramadol 50mg  q8h prn pain disp #30. Thanks.

## 2015-07-19 ENCOUNTER — Encounter: Payer: BC Managed Care – PPO | Admitting: Podiatry

## 2015-07-19 ENCOUNTER — Ambulatory Visit (INDEPENDENT_AMBULATORY_CARE_PROVIDER_SITE_OTHER): Payer: BC Managed Care – PPO | Admitting: Podiatry

## 2015-07-19 ENCOUNTER — Encounter: Payer: Self-pay | Admitting: Podiatry

## 2015-07-19 DIAGNOSIS — Z981 Arthrodesis status: Secondary | ICD-10-CM

## 2015-07-19 NOTE — Patient Instructions (Signed)

## 2015-07-22 NOTE — Progress Notes (Signed)
Patient ID: Lindsey Warren, female   DOB: Jun 03, 1961, 55 y.o.   MRN: [id number]  Patient presents to pick up orthotics. Was seen by the MA. Oral and written break in instructions provided. Follow-up with me in 6 weeks or sooner if any issues are to arise. Call with any questions or concerns.

## 2015-08-30 ENCOUNTER — Ambulatory Visit (INDEPENDENT_AMBULATORY_CARE_PROVIDER_SITE_OTHER): Payer: BC Managed Care – PPO | Admitting: Podiatry

## 2015-08-30 ENCOUNTER — Encounter: Payer: Self-pay | Admitting: Podiatry

## 2015-08-30 ENCOUNTER — Ambulatory Visit (INDEPENDENT_AMBULATORY_CARE_PROVIDER_SITE_OTHER): Payer: BC Managed Care – PPO

## 2015-08-30 VITALS — BP 111/77 | HR 72 | Resp 18

## 2015-08-30 DIAGNOSIS — Z981 Arthrodesis status: Secondary | ICD-10-CM

## 2015-08-30 NOTE — Progress Notes (Signed)
Patient ID: JAIAH BATTERSHELL, female   DOB: 21-May-1961, 55 y.o.   MRN: [id number]  DOS: 11/17/14 s/p Right gastroc recession, medial calcaneal slide osteotomy, talo-navicular joint arthrodesis  Objective: Ms. Lemery presents the office today status post right foot surgery. Since last appointment she has been able to wear regular shoe and she's been wearing the orthotics. She states in the morning she still gets some stiffness. She states is not at been as bad as previously. She denies any specific pain to her foot however after being on her foot for quite some time she has had some discomfort. She has started to increase her walking around a track and she is up to 1 lateral that she states that she has not been pushing it and she has not had a time and she has been sick. No recent injury or trauma. No other complaints at this time.  Objective: AAO 3, NAD; presents in a CAM boot DP/PT pulses palpable, CRT less than 3 seconds Protective sensation intact with Simms Weinstein monofilament Incisions on the medial mid calf, lateral calcaneus, dorsal medial midfoot, posterior heel are all well coapted and the scar is well formed. There is no surrounding erythema, ascending cellulitis, fluctuance, crepitus, drainage, purulence, malodor. There is trace edema around the surgical site. There is no tenderness to palpation along the surgical site at the bilateral feet at this time. Subtalar joint motion is limited.  No other areas of tenderness to bilateral lower extremities. No other open lesions or pre-ulcer lesions identified bilaterally. No pain with calf compression, swelling, warmth, erythema.  Assessment: 55 year old female status post right foot surgery with intermittent foot pain  Plan: -X-rays were obtained and reviewed with the patient.  There does appear to be increased consolidation across the arthrodesis site. -Treatment options discussed including all alternatives, risks, and  complications -Continue supportive shoe gear and orthotics. She is increased activity and walking discussed that she may wear the ankle brace as needed. -Continue stretching exercises. -Monitor for any signs or symptoms of infection and DVT/PE and directed to call the office immediately should any occur or go to the ER. -Follow up in 2 months or sooner if any problems are to arise. In the meantime I encouraged her to call the office with any questions, concerns, change in symptoms.  Celesta Gentile, DPM

## 2015-11-01 ENCOUNTER — Ambulatory Visit (INDEPENDENT_AMBULATORY_CARE_PROVIDER_SITE_OTHER): Payer: BC Managed Care – PPO

## 2015-11-01 ENCOUNTER — Ambulatory Visit (INDEPENDENT_AMBULATORY_CARE_PROVIDER_SITE_OTHER): Payer: BC Managed Care – PPO | Admitting: Podiatry

## 2015-11-01 ENCOUNTER — Encounter: Payer: Self-pay | Admitting: Podiatry

## 2015-11-01 DIAGNOSIS — Z981 Arthrodesis status: Secondary | ICD-10-CM | POA: Diagnosis not present

## 2015-11-01 DIAGNOSIS — M779 Enthesopathy, unspecified: Secondary | ICD-10-CM | POA: Diagnosis not present

## 2015-11-01 DIAGNOSIS — R269 Unspecified abnormalities of gait and mobility: Secondary | ICD-10-CM

## 2015-11-04 NOTE — Progress Notes (Signed)
Patient ID: Lindsey Warren, female   DOB: 12-Feb-1961, 55 y.o.   MRN: [id number]  DOS: 11/17/14 s/p Right gastroc recession, medial calcaneal slide osteotomy, talo-navicular joint arthrodesis  Objective: Lindsey Warren presents the office today . Evaluation of right foot surgery. She said that she is doing well her only concern is the way she has been walking. She feels that she does not list her foot up in order to walk. She does that she has good strength when she is walking she is not walking and normal heel-to-toe gait. Lindsey Warren gets some occasional numbness and tingling to the back of her heel other than that the pain to the actual surgical sites is minimal. No recent injury or trauma. No other complaints at this time.  Objective: AAO 3, NAD; presents in a CAM boot DP/PT pulses palpable, CRT less than 3 seconds Protective sensation intact with Lindsey Warren monofilament Incisions on the medial mid calf, lateral calcaneus, dorsal medial midfoot, posterior heel are all well coapted and the scar is well formed. There is no surrounding erythema, ascending cellulitis, fluctuance, crepitus, drainage, purulence, malodor. There is no significant edema around the surgical site. There is no tenderness to palpation along the surgical site at the bilateral feet at this time. Subtalar joint motion is limited. There is some occasional subjective numbness and tingling to the posterior heel on the incision. During gait she does not have a normal heel to toe gait and she seems to have difficulty in dorsiflexion. She does have good strength in both dorsiflexion and plantarflexion nonweightbearing however. No other areas of tenderness to bilateral lower extremities. No other open lesions or pre-ulcer lesions identified bilaterally. No pain with calf compression, swelling, warmth, erythema.  Assessment: 55 year old female status post right foot surgery with gait changes  Plan: -X-rays were obtained and reviewed with  the patient.  There does appear to be increased consolidation across the arthrodesis site. -At this time I recommended her to return to physical therapy for gait training. I did prescribe this today and a prescription was provided today. -I ordered a neuropathy cream that she can use the posterior heel help with the nerve symptoms she is having. -Follow up as scheduled/or after PT or sooner if any problems are to arise. In the meantime I encouraged her to call the office with any questions, concerns, change in symptoms.  Lindsey Warren, DPM

## 2015-11-28 ENCOUNTER — Ambulatory Visit (INDEPENDENT_AMBULATORY_CARE_PROVIDER_SITE_OTHER): Payer: BC Managed Care – PPO | Admitting: Podiatry

## 2015-11-28 DIAGNOSIS — M779 Enthesopathy, unspecified: Secondary | ICD-10-CM

## 2015-12-07 NOTE — Progress Notes (Signed)
Betha evaluated pts complaint

## 2015-12-20 ENCOUNTER — Encounter: Payer: Self-pay | Admitting: Podiatry

## 2015-12-20 ENCOUNTER — Ambulatory Visit (INDEPENDENT_AMBULATORY_CARE_PROVIDER_SITE_OTHER): Payer: BC Managed Care – PPO | Admitting: Podiatry

## 2015-12-20 DIAGNOSIS — M79671 Pain in right foot: Secondary | ICD-10-CM | POA: Diagnosis not present

## 2015-12-20 DIAGNOSIS — Z981 Arthrodesis status: Secondary | ICD-10-CM

## 2015-12-20 NOTE — Progress Notes (Signed)
Patient ID: Lindsey Warren, female   DOB: 07-23-1960, 55 y.o.   MRN: [id number]  DOS: 11/17/14 s/p Right gastroc recession, medial calcaneal slide osteotomy, talo-navicular joint arthrodesis  Objective: Lindsey Warren presents the office today for follow-up evaluation of right foot surgery. She has gone to physical therapy and she has made good improvement. She presents today wearing a supportive sandal and doing well. She states that she has made good improvement. She gets some stiffness on the mornings when she gets up however overall she is improved. She is able to wear regular shoe and do well at work. No recent injury. No swelling or redness. She does that she does feel that she has improved compared to what it was prior to surgery. No other complaints at this time.  Objective: AAO 3, NAD; presents in a CAM boot DP/PT pulses palpable, CRT less than 3 seconds Protective sensation intact with Simms Weinstein monofilament Incisions on the medial mid calf, lateral calcaneus, dorsal medial midfoot, posterior heel are all well coapted and the scar is well formed. There is no surrounding erythema, ascending cellulitis, fluctuance, crepitus, drainage, purulence, malodor. There is no edema to the foot. There is no area of tenderness.  No other open lesions or pre-ulcer lesions identified bilaterally. No pain with calf compression, swelling, warmth, erythema.  Assessment: 55 year old female status post right foot surgery with improved symptoms.  Plan: -Treatment options discussed including all alternatives, risks, and complications -At this time continue with supportive shoes as well as orthotics. She recently did change her shoes and his been helping. -Continue home physical therapy. She feels that she is doing the same exercises home that she is able to do physical therapy. At this point we'll discontinue physical therapy and she agrees to this. She is doing well. I'll follow-up with her as needed  and she is discharged from surgery. I encouraged her to call the office of loosening questions, concerns or any change in symptoms.  Celesta Gentile, DPM

## 2016-01-22 ENCOUNTER — Ambulatory Visit (INDEPENDENT_AMBULATORY_CARE_PROVIDER_SITE_OTHER): Payer: BC Managed Care – PPO

## 2016-01-22 ENCOUNTER — Ambulatory Visit (INDEPENDENT_AMBULATORY_CARE_PROVIDER_SITE_OTHER): Payer: BC Managed Care – PPO | Admitting: Podiatry

## 2016-01-22 ENCOUNTER — Encounter: Payer: Self-pay | Admitting: Podiatry

## 2016-01-22 DIAGNOSIS — R52 Pain, unspecified: Secondary | ICD-10-CM

## 2016-01-22 DIAGNOSIS — S93401A Sprain of unspecified ligament of right ankle, initial encounter: Secondary | ICD-10-CM | POA: Diagnosis not present

## 2016-01-22 MED ORDER — MELOXICAM 15 MG PO TABS: 15.0000 mg | ORAL_TABLET | Freq: Every day | ORAL | 2 refills | Status: DC

## 2016-01-27 NOTE — Progress Notes (Signed)
Patient ID: Lindsey Warren, female   DOB: 1960-11-06, 55 y.o.   MRN: [id number]  DOS: 11/17/14 s/p Right gastroc recession, medial calcaneal slide osteotomy, talo-navicular joint arthrodesis  Objective: [name] presents the office today for concerns of right ankle pain. 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 Since then she has had pain to her ankle. She denies a pain to the surgical sites. She's had some swelling to the ankle but is his been somewhat improved. No redness or warmth. No other complaints at this time.  Objective: AAO 3, NAD; presents in a CAM boot DP/PT pulses palpable, CRT less than 3 seconds Protective sensation intact with Simms Weinstein monofilament Incision is well coapted and there is a scar overlying the area. There is no tenderness palpation of the surgical sites. There is tenderness along the right ankle to global of the ATFL to somewhat degree the CFL. Is no [place] PTF L or medial ankle ligaments. Ankle range of motion intact. Mild edema along the course the ATFL any erythema or increase in warmth. There is no gross ankle instability present. No proximal tib-fib pain No other open lesions or pre-ulcer lesions identified bilaterally. No pain with calf compression, swelling, warmth, erythema.  Assessment: 55 year old female right ankle sprain  Plan: -Treatment options discussed including all alternatives, risks, and complications -X-rays obtain reviewed. No evidence of acute fracture or stress fracture at this time. -This time recommended her to go back into her ankle brace, ice and elevation as well as anti-inflammatories. Also start physical therapy for ankle sprain rehabilitation. -Follow-up as scheduled or sooner if needed. -If symptoms worsen go into the CAM boot.  Celesta Gentile, DPM

## 2016-02-12 ENCOUNTER — Ambulatory Visit: Payer: BC Managed Care – PPO | Admitting: Podiatry

## 2016-02-28 ENCOUNTER — Ambulatory Visit (INDEPENDENT_AMBULATORY_CARE_PROVIDER_SITE_OTHER): Payer: BC Managed Care – PPO | Admitting: Podiatry

## 2016-02-28 ENCOUNTER — Encounter: Payer: Self-pay | Admitting: Podiatry

## 2016-02-28 ENCOUNTER — Ambulatory Visit (INDEPENDENT_AMBULATORY_CARE_PROVIDER_SITE_OTHER): Payer: BC Managed Care – PPO

## 2016-02-28 DIAGNOSIS — S93401A Sprain of unspecified ligament of right ankle, initial encounter: Secondary | ICD-10-CM

## 2016-02-28 DIAGNOSIS — R52 Pain, unspecified: Secondary | ICD-10-CM | POA: Diagnosis not present

## 2016-02-28 MED ORDER — METHYLPREDNISOLONE 4 MG PO TBPK: ORAL_TABLET | ORAL | 0 refills | Status: DC

## 2016-03-06 NOTE — Progress Notes (Signed)
Patient ID: Lindsey Warren, female   DOB: 27-Aug-1960, 55 y.o.   MRN: [id number]  Objective: Lindsey Warren presents the office today for concerns of right ankle pain. She says that it has gotten somewhat better but she is still having some pain status post ankle sprain. She is doing well from the surgical sites. She's been discharged from physical therapy. She has continue with home exercises. The swelling has improved. No redness or warmth or any sores. No other complaints at this time.  Objective: AAO 3, NAD; presents in a CAM boot DP/PT pulses palpable, CRT less than 3 seconds Protective sensation intact with Simms Weinstein monofilament Incision is well coapted and there is a scar overlying the area. There is no tenderness palpation of the surgical sites. There is continued decreased tenderness to palpation on the course the ATFL. There is no pillar course the CFL or PTF L today or along the medial ligaments or syndesmosis. No proximal tib-fib pain. No foot pain. No pain with ankle or subtalar joint range of motion. There is some trace edema along the lateral aspect of the ankle. No other open lesions or pre-ulcer lesions identified bilaterally. No pain with calf compression, swelling, warmth, erythema.  Assessment: 55 year old female right ankle sprain improved but still having some discomfort  Plan: -Treatment options discussed including all alternatives, risks, and complications -Discussed with her to continue her home range of motion, rehabilitation exercises. Ankle brace as needed for her to not wear this as possible. Continue ice and elevation holes anti-inflammatories. -Medrol dose pack, monitor blood sugar closely -Compression -Follow-up as scheduled or sooner if needed.  Lindsey Warren, DPM

## 2016-03-27 ENCOUNTER — Encounter: Payer: Self-pay | Admitting: Podiatry

## 2016-03-27 ENCOUNTER — Ambulatory Visit (INDEPENDENT_AMBULATORY_CARE_PROVIDER_SITE_OTHER): Payer: BC Managed Care – PPO | Admitting: Podiatry

## 2016-03-27 DIAGNOSIS — S93491S Sprain of other ligament of right ankle, sequela: Secondary | ICD-10-CM

## 2016-04-03 NOTE — Progress Notes (Signed)
Patient ID: Lindsey Warren, female   DOB: Oct 29, 1960, 55 y.o.   MRN: [id number]  Objective: Lindsey Warren presents the office today for follow-up evaluation of right ankle pain. She does that she has made some improvement compared to last appointment but she does continue to get some swelling. She is able to work and perform daily activities however as she is more active is when she has more pain. She has not been on physical therapy but she does continue with home exercises. She denies any redness or warmth to the area still swells intermittently to the outside aspect of her ankle. She states the surgical sites are doing well. No other complaints at this time.  Objective: AAO 3, NAD; presents in a CAM boot DP/PT pulses palpable, CRT less than 3 seconds Protective sensation intact with Simms Weinstein monofilament Incision is well coapted and there is a scar overlying the area. There is no tenderness palpation of the surgical sites. There is mild continued tenderness to palpation on the course the ATFL. There is no pain along the course of the CFL or PTF L today or along the medial ligaments or syndesmosis. No proximal tib-fib pain. No foot pain. No pain with ankle or subtalar joint range of motion. There is some trace edema along the lateral aspect of the ankle. No other open lesions or pre-ulcer lesions identified bilaterally. No pain with calf compression, swelling, warmth, erythema.  Assessment: 55 year old female right ankle sprain improving but does continue  Plan: -Treatment options discussed including all alternatives, risks, and complications -Discussed the repeat MRI but she wishes to hold off on that this time. Continue with home exercises as well as supportive shoe gear. Ankle brace if needed. She states that she feels that she is slowly making improvements and we'll continue with this treatment for now. She states that she has to off the surgical sites. We'll continue to monitor  closely. Follow-up next 4-6 weeks or sooner if needed. Call any questions or concerns meantime.  Celesta Gentile, DPM

## 2016-04-12 IMAGING — CT CT OF THE RIGHT FOOT WITHOUT CONTRAST
3 series · 11 of 33 positions shown, 13 images · non-contrast
Comparison: None.

CLINICAL DATA: Chronic flatfoot deformity. Preoperative planning
examination.

EXAM:
CT OF THE RIGHT FOOT WITHOUT CONTRAST
TECHNIQUE: Multidetector CT imaging of the right foot was performed according
to the standard protocol. Multiplanar CT image reconstructions were
also generated.

[Series 6: ax foot soft tissue 2.0 · axial · 0.22mm/px · z∈[+451,+591]mm · 3 of 125 slices shown, 4 images]
[im 29/125  soft-tissue]
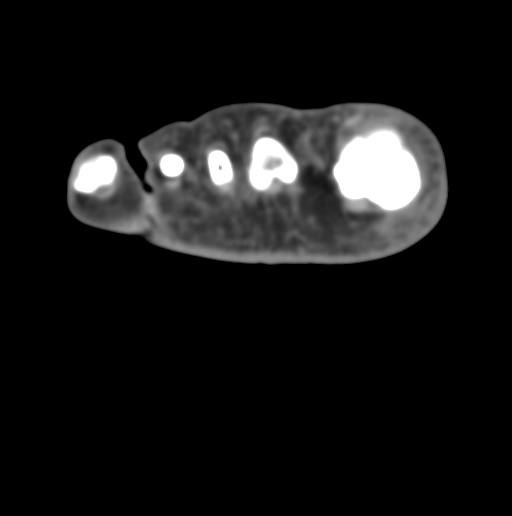
[im 29/125  bone]
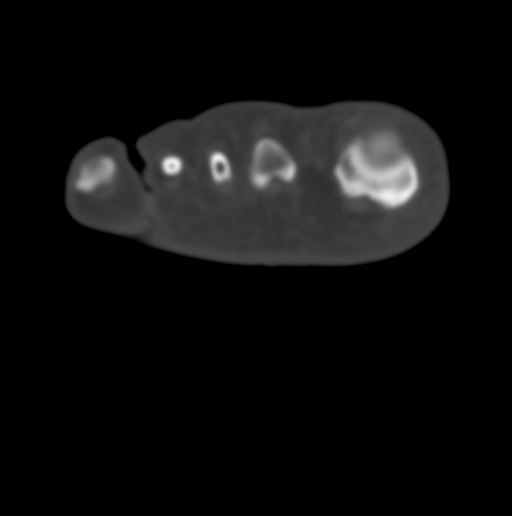
[im 67/125  bone]
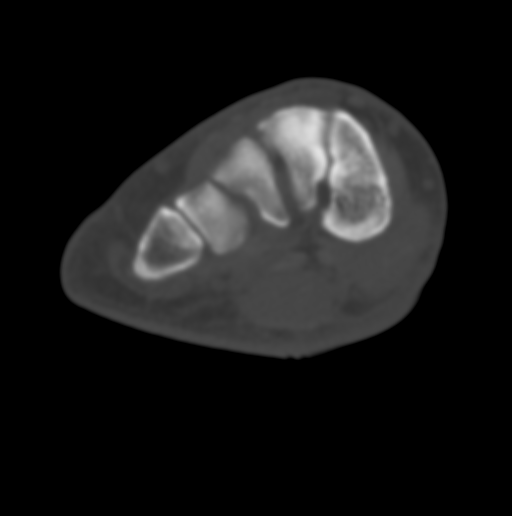
[im 105/125  bone]
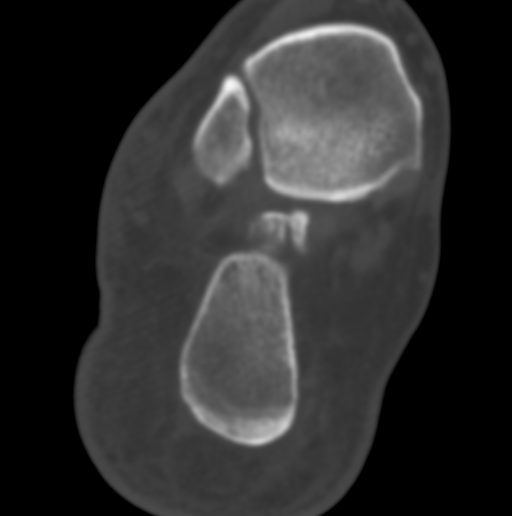

[Series 8: coronal foot st 2.0 · coronal · 0.20mm/px · 3 of 63 slices shown]
[im 14/63  bone]
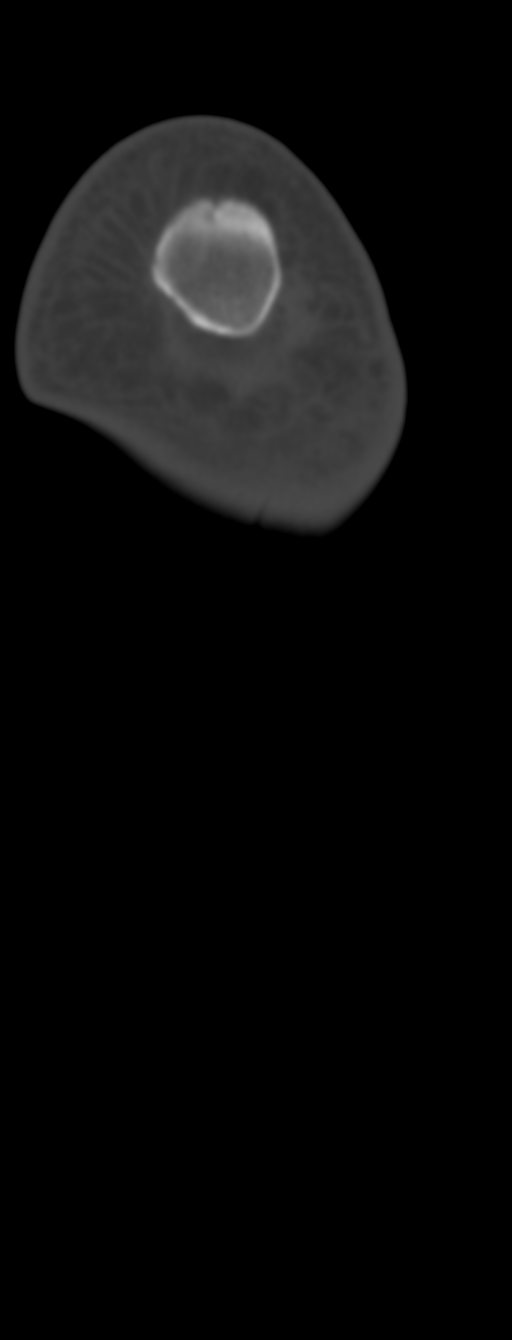
[im 26/63  bone]
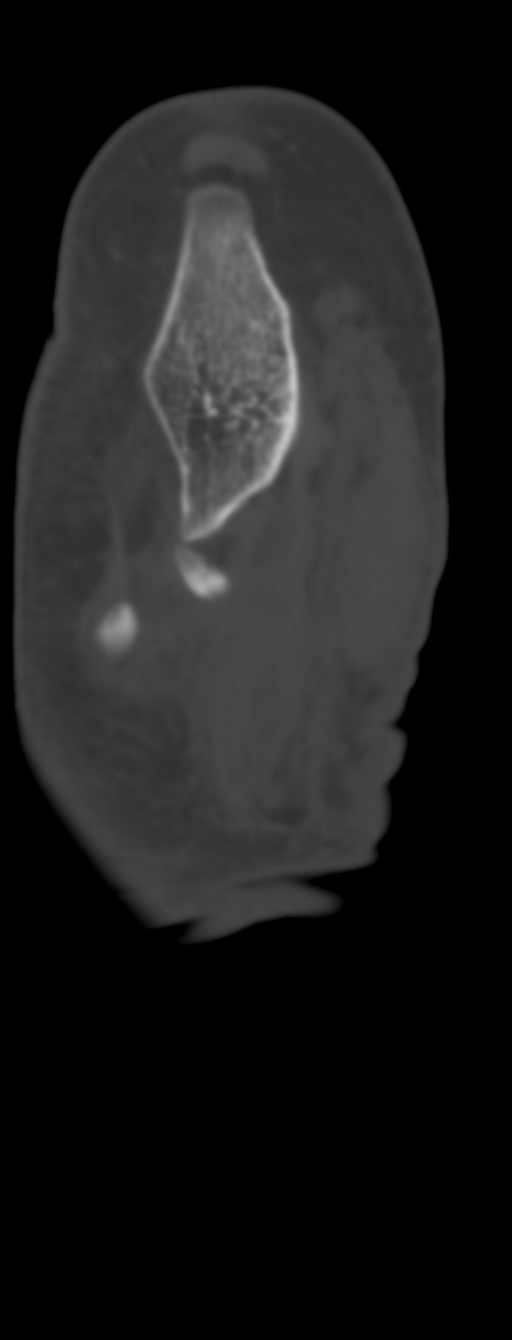
[im 38/63  bone]
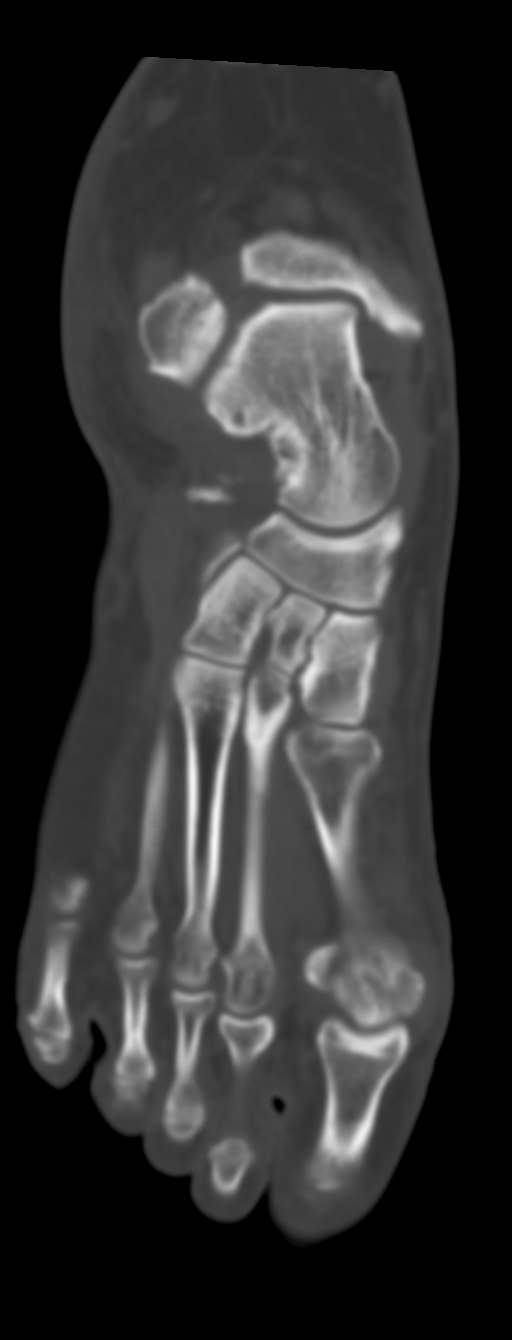

[Series 10: sagittal foot st 2.0 · sagittal · 0.32mm/px · 5 of 53 slices shown, 6 images]
[im 18/53  bone]
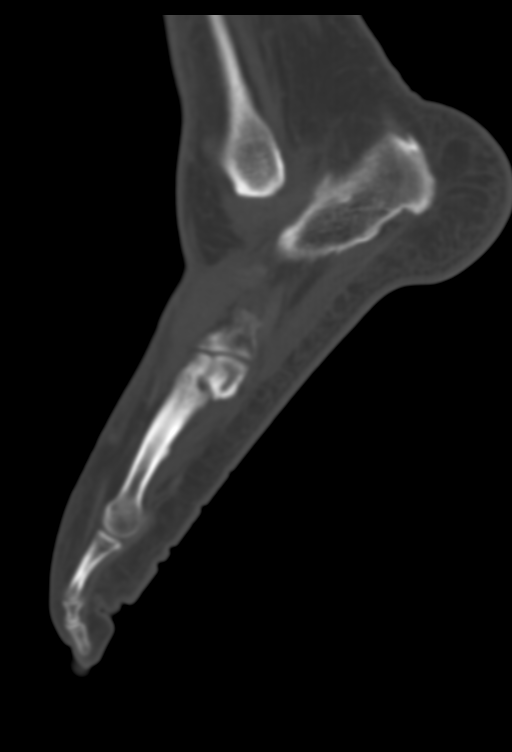
[im 22/53  bone]
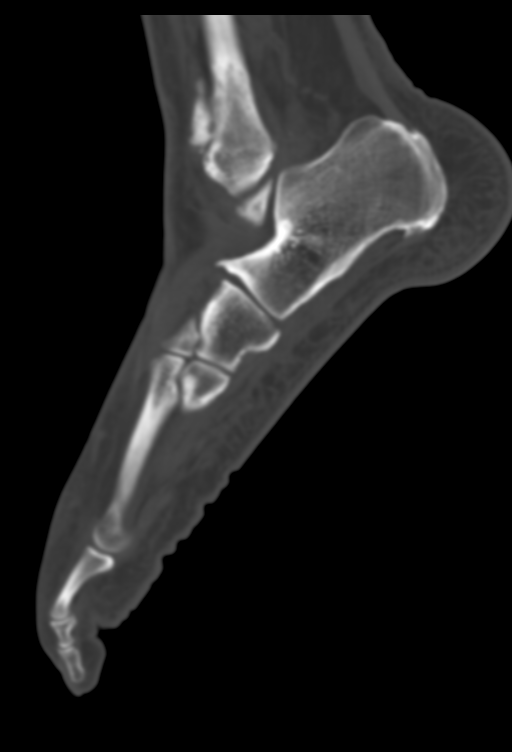
[im 27/53  soft-tissue]
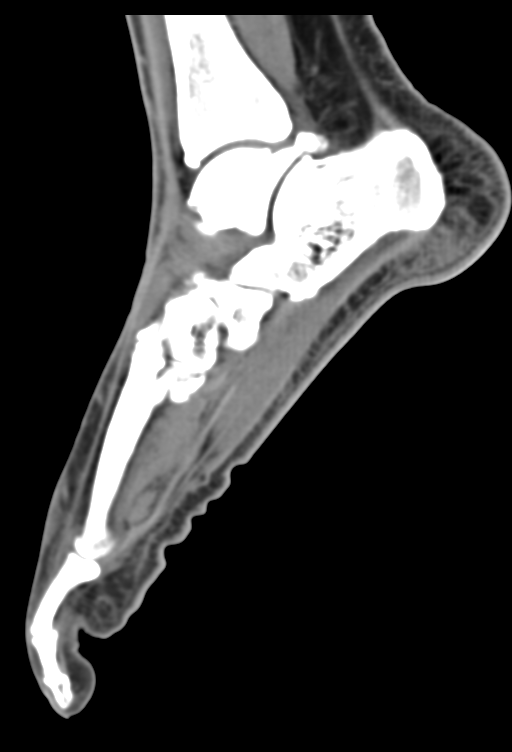
[im 27/53  bone]
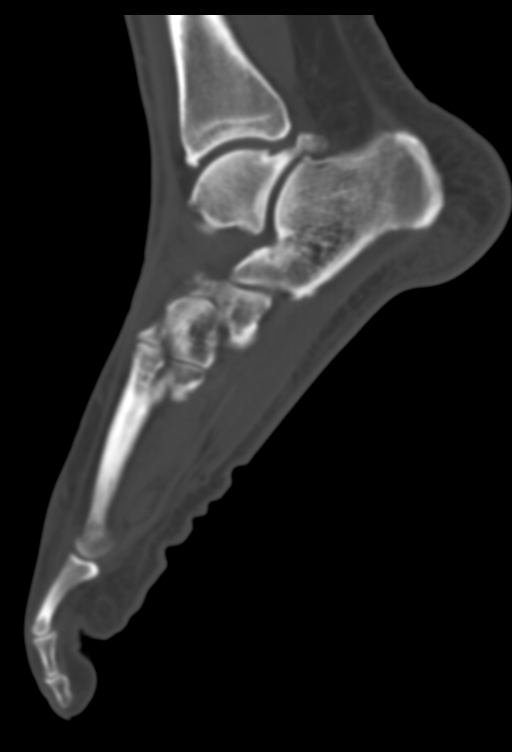
[im 31/53  bone]
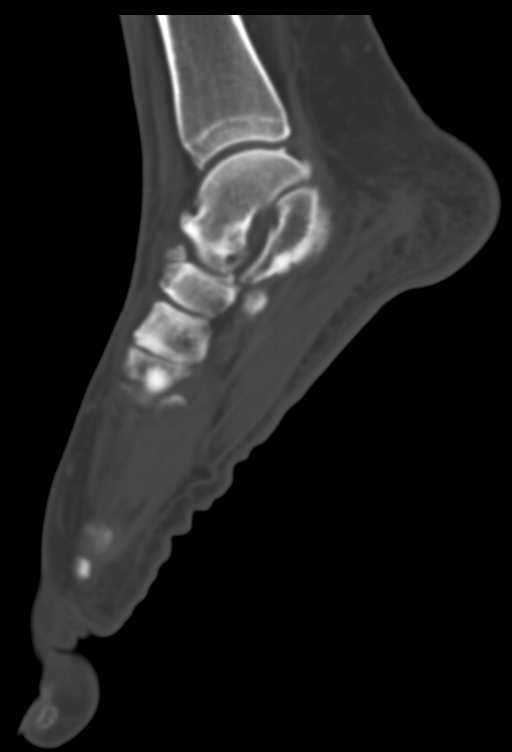
[im 35/53  bone]
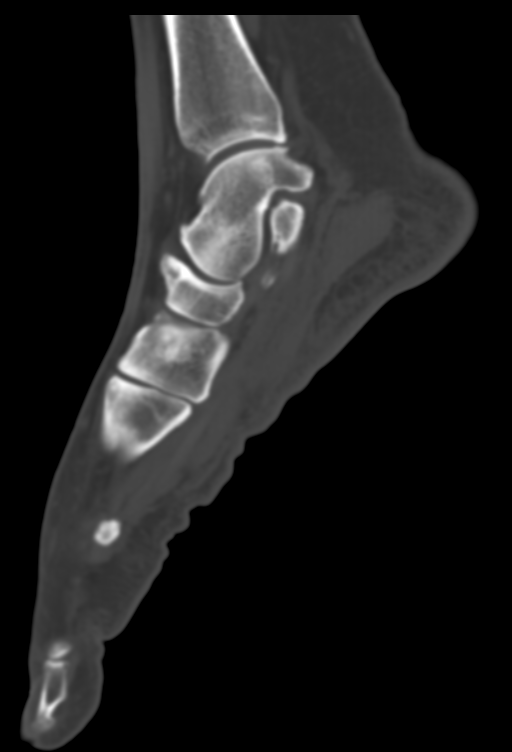

[11 of 33 positions shown; findings below may reference images not displayed]

FINDINGS: Pes planus deformity is identified. No acute bony or joint
abnormality is seen. Degenerative change about the foot is most
notable at the talonavicular joint. There is no tarsal coalition. Os
trigonum is noted. Small plantar calcaneal spur is also seen. No
focal bony lesion is present. No fracture is identified.
IMPRESSION: No acute finding.

Pes planus.

Osteoarthritis most notable about the talonavicular joint.

## 2017-09-02 ENCOUNTER — Encounter: Payer: Self-pay | Admitting: *Deleted

## 2017-09-02 ENCOUNTER — Other Ambulatory Visit: Payer: Self-pay

## 2017-09-02 ENCOUNTER — Ambulatory Visit
Admission: EM | Admit: 2017-09-02 | Discharge: 2017-09-02 | Disposition: A | Discharge status: Home / Self Care | Payer: BC Managed Care – PPO | Attending: Family Medicine | Admitting: Family Medicine

## 2017-09-02 DIAGNOSIS — H6503 Acute serous otitis media, bilateral: Secondary | ICD-10-CM | POA: Diagnosis not present

## 2017-09-02 MED ORDER — AMOXICILLIN 875 MG PO TABS: 875.0000 mg | ORAL_TABLET | Freq: Two times a day (BID) | ORAL | 0 refills | Status: DC

## 2017-09-02 NOTE — ED Provider Notes (Signed)
[facility]    CSN: [id number] Arrival date & time: 09/02/17  1432     History   Chief Complaint Chief Complaint  Patient presents with  . Ear Fullness    HPI Lindsey Warren is a 57 y.o. female.   The history is provided by the patient.  Ear Fullness   URI  Presenting symptoms: congestion, ear pain and rhinorrhea   Severity:  Moderate Onset quality:  Sudden Duration:  1 week Timing:  Constant Progression:  Worsening Chronicity:  New Relieved by:  None tried Ineffective treatments:  None tried Associated symptoms: no wheezing   Risk factors: diabetes mellitus and sick contacts   Risk factors: not elderly, no chronic cardiac disease, no chronic kidney disease, no chronic respiratory disease, no immunosuppression, no recent illness and no recent travel     Past Medical History:  Diagnosis Date  . Diabetes mellitus without complication ([place])   . Hypertension     There are no active problems to display for this patient.   History reviewed. No pertinent surgical history.  OB History    No data available       Home Medications    Prior to Admission medications   Medication Sig Start Date End Date Taking? Authorizing Provider  amLODipine (NORVASC) 10 MG tablet take 1 tablet by mouth once daily 03/13/14  Yes [provider]  gabapentin (NEURONTIN) 300 MG capsule take 1 capsule by mouth at bedtime 06/10/14  Yes [provider]  lisinopril-hydrochlorothiazide (PRINZIDE,ZESTORETIC) 20-25 MG per tablet Take by mouth. 09/14/14  Yes [provider]  metFORMIN (GLUCOPHAGE-XR) 500 MG 24 hr tablet take 1 tablet by mouth once daily every morning BEFORE BREAKFAST 09/08/14  Yes [provider]  amoxicillin (AMOXIL) 875 MG tablet Take 1 tablet (875 mg total) by mouth 2 (two) times daily. 09/02/17   Norval Gable, MD  meloxicam (MOBIC) 15 MG tablet Take 1 tablet (15 mg total) by mouth daily. 01/22/16   Trula Slade, DPM    methylPREDNISolone (MEDROL DOSEPAK) 4 MG TBPK tablet Take as directed 02/28/16   Trula Slade, DPM  oxyCODONE-acetaminophen (ROXICET) 5-325 MG per tablet Take 1-2 tablets by mouth every 6 (six) hours as needed for severe pain. 01/23/15   Trula Slade, DPM  traMADol (ULTRAM) 50 MG tablet Take 1 tablet (50 mg total) by mouth every 8 (eight) hours as needed. 06/26/15   Trula Slade, DPM    Family History Family History  Problem Relation Age of Onset  . Hypertension Mother   . Diabetes Mother   . Diabetes Father   . Hypertension Father     Social History Social History   Tobacco Use  . Smoking status: Never Smoker  . Smokeless tobacco: Never Used  Substance Use Topics  . Alcohol use: No  . Drug use: No     Allergies   Patient has no known allergies.   Review of Systems Review of Systems  HENT: Positive for congestion, ear pain and rhinorrhea.   Respiratory: Negative for wheezing.      Physical Exam Triage Vital Signs ED Triage Vitals  Enc Vitals Group     BP 09/02/17 1447 126/90     Pulse Rate 09/02/17 1447 (!) 103     Resp 09/02/17 1447 16     Temp 09/02/17 1447 98.1 F (36.7 C)     Temp src --      SpO2 09/02/17 1447 100 %     Weight  09/02/17 1448 210 lb (95.3 kg)     Height 09/02/17 1448 5\' 6"  (1.676 m)     Head Circumference --      Peak Flow --      Pain Score 09/02/17 1447 0     Pain Loc --      Pain Edu? --      Excl. in [place]? --    No data found.  Updated Vital Signs BP 126/90 (BP Location: Left Arm)   Pulse (!) 103   Temp 98.1 F (36.7 C)   Resp 16   Ht 5\' 6"  (1.676 m)   Wt 210 lb (95.3 kg)   LMP  (LMP Unknown)   SpO2 100%   BMI 33.89 kg/m   Visual Acuity Right Eye Distance:   Left Eye Distance:   Bilateral Distance:    Right Eye Near:   Left Eye Near:    Bilateral Near:     Physical Exam  Constitutional: She appears well-developed and well-nourished.  Non-toxic appearance. She does not have a sickly appearance. No  distress.  HENT:  Head: Normocephalic and atraumatic.  Right Ear: External ear and ear canal normal. Tympanic membrane is erythematous and bulging. A middle ear effusion is present.  Left Ear: External ear and ear canal normal. Tympanic membrane is erythematous and bulging. A middle ear effusion is present.  Nose: Mucosal edema and rhinorrhea present. No nose lacerations, sinus tenderness, nasal deformity, septal deviation or nasal septal hematoma. No epistaxis.  No foreign bodies. Right sinus exhibits no maxillary sinus tenderness and no frontal sinus tenderness. Left sinus exhibits no maxillary sinus tenderness and no frontal sinus tenderness.  Mouth/Throat: Uvula is midline, oropharynx is clear and moist and mucous membranes are normal. No oropharyngeal exudate.  Eyes: Conjunctivae and EOM are normal. Pupils are equal, round, and reactive to light. Right eye exhibits no discharge. Left eye exhibits no discharge. No scleral icterus.  Neck: Normal range of motion. Neck supple. No thyromegaly present.  Cardiovascular: Normal rate, regular rhythm and normal heart sounds.  Pulmonary/Chest: Effort normal and breath sounds normal. No respiratory distress. She has no wheezes. She has no rales.  Lymphadenopathy:    She has no cervical adenopathy.  Skin: She is not diaphoretic.  Nursing note and vitals reviewed.    UC Treatments / Results  Labs (all labs ordered are listed, but only abnormal results are displayed) Labs Reviewed - No data to display  EKG  EKG Interpretation None       Radiology No results found.  Procedures Procedures (including critical care time)  Medications Ordered in UC Medications - No data to display   Initial Impression / Assessment and Plan / UC Course  I have reviewed the triage vital signs and the nursing notes.  Pertinent labs & imaging results that were available during my care of the patient were reviewed by me and considered in my medical decision  making (see chart for details).       Final Clinical Impressions(s) / UC Diagnoses   Final diagnoses:  Bilateral acute serous otitis media, recurrence not specified    ED Discharge Orders        Ordered    amoxicillin (AMOXIL) 875 MG tablet  2 times daily     09/02/17 1518     1. diagnosis reviewed with patient 2. rx as per orders above; reviewed possible side effects, interactions, risks and benefits  3. Recommend supportive treatment with rest, fluids, otc analgesics prn 4. Follow-up  prn if symptoms worsen or don't improve  Controlled Substance Prescriptions [place] Controlled Substance Registry consulted? Not Applicable   Norval Gable, MD 09/02/17 1530

## 2017-09-02 NOTE — ED Triage Notes (Signed)
PAtient started having bilateral ear fullness 1 week ago. Patient reports everything sounds muffled.

## 2018-02-06 ENCOUNTER — Other Ambulatory Visit: Payer: Self-pay

## 2018-02-06 ENCOUNTER — Ambulatory Visit
Admission: EM | Admit: 2018-02-06 | Discharge: 2018-02-06 | Disposition: A | Discharge status: Home / Self Care | Payer: BC Managed Care – PPO | Attending: Family | Admitting: Family

## 2018-02-06 ENCOUNTER — Encounter: Payer: Self-pay | Admitting: Emergency Medicine

## 2018-02-06 DIAGNOSIS — H6993 Unspecified Eustachian tube disorder, bilateral: Secondary | ICD-10-CM | POA: Diagnosis not present

## 2018-02-06 DIAGNOSIS — H9203 Otalgia, bilateral: Secondary | ICD-10-CM | POA: Diagnosis not present

## 2018-02-06 MED ORDER — PREDNISONE 10 MG (21) PO TBPK: ORAL_TABLET | ORAL | 0 refills | Status: DC

## 2018-02-06 NOTE — ED Provider Notes (Signed)
[facility]    CSN: [id number] Arrival date & time: 02/06/18  1048     History   Chief Complaint Chief Complaint  Patient presents with  . Ear Fullness    HPI Lindsey Warren is a 57 y.o. female.   57 year old female presents with bilateral ear fullness and discomfort for the past 2 to 3 days. Feels "off balance" and slightly dizzy. Denies any fever, sore throat, cough or GI symptoms. Is having some nasal congestion. Has tried Flonase and Zyrtec for symptoms with no relief. No recent airplane trips or change in altitude but has traveled by car from [place]. Has history of recent ear infection in March 2019 here at [facility] and was placed on antibiotics and Prednisone with success. Other chronic health issues include HTN, Diabetes, and insomnia in which she takes Norvasc, Lisinopril HCTZ, Metformin daily and Neurontin prn.   The history is provided by the patient.    Past Medical History:  Diagnosis Date  . Diabetes mellitus without complication ([place])   . Hypertension     There are no active problems to display for this patient.   History reviewed. No pertinent surgical history.  OB History   None      Home Medications    Prior to Admission medications   Medication Sig Start Date End Date Taking? Authorizing Provider  amLODipine (NORVASC) 10 MG tablet take 1 tablet by mouth once daily 03/13/14  Yes [provider]  gabapentin (NEURONTIN) 300 MG capsule take 1 capsule by mouth at bedtime 06/10/14  Yes [provider]  lisinopril-hydrochlorothiazide (PRINZIDE,ZESTORETIC) 20-25 MG per tablet Take by mouth. 09/14/14  Yes [provider]  metFORMIN (GLUCOPHAGE-XR) 500 MG 24 hr tablet take 1 tablet by mouth once daily every morning BEFORE BREAKFAST 09/08/14  Yes [provider]  predniSONE (STERAPRED UNI-PAK 21 TAB) 10 MG (21) TBPK tablet Take 6 tabs by mouth daily the first day then decrease by 1 tablet each day  until finished on day 6. 02/06/18   Anuj Summons, Nicholes Stairs, NP    Family History Family History  Problem Relation Age of Onset  . Hypertension Mother   . Diabetes Mother   . Diabetes Father   . Hypertension Father     Social History Social History   Tobacco Use  . Smoking status: Never Smoker  . Smokeless tobacco: Never Used  Substance Use Topics  . Alcohol use: No  . Drug use: No     Allergies   Patient has no known allergies.   Review of Systems Review of Systems  Constitutional: Negative for activity change, appetite change, chills, fatigue and fever.  HENT: Positive for congestion, ear pain (discomfort/fullness), postnasal drip and rhinorrhea. Negative for ear discharge, facial swelling, hearing loss, mouth sores, nosebleeds, sinus pressure, sinus pain, sneezing, sore throat and trouble swallowing.   Eyes: Negative for pain, discharge, redness and itching.  Respiratory: Negative for cough, chest tightness, shortness of breath and wheezing.   Gastrointestinal: Negative for nausea and vomiting.  Musculoskeletal: Negative for arthralgias, myalgias, neck pain and neck stiffness.  Skin: Negative for color change, rash and wound.  Neurological: Positive for light-headedness and headaches. Negative for tremors, seizures, syncope, facial asymmetry, speech difficulty, weakness and numbness.  Hematological: Negative for adenopathy. Does not bruise/bleed easily.  Psychiatric/Behavioral: Positive for sleep disturbance.     Physical Exam Triage Vital Signs ED Triage Vitals  Enc Vitals Group     BP 02/06/18 1059 115/78  Pulse Rate 02/06/18 1059 71     Resp 02/06/18 1059 16     Temp 02/06/18 1059 98.2 F (36.8 C)     Temp Source 02/06/18 1059 Oral     SpO2 02/06/18 1059 100 %     Weight 02/06/18 1102 210 lb (95.3 kg)     Height 02/06/18 1102 5\' 5"  (1.651 m)     Head Circumference --      Peak Flow --      Pain Score 02/06/18 1101 0     Pain Loc --      Pain Edu? --       Excl. in [place]? --    No data found.  Updated Vital Signs BP 115/78 (BP Location: Left Arm)   Pulse 71   Temp 98.2 F (36.8 C) (Oral)   Resp 16   Ht 5\' 5"  (1.651 m)   Wt 210 lb (95.3 kg)   LMP  (LMP Unknown)   SpO2 100%   BMI 34.95 kg/m   Visual Acuity Right Eye Distance:   Left Eye Distance:   Bilateral Distance:    Right Eye Near:   Left Eye Near:    Bilateral Near:     Physical Exam  Constitutional: She is oriented to person, place, and time. Vital signs are normal. She appears well-developed and well-nourished. She is cooperative. She does not appear ill. No distress.  She is sitting on exam table in no acute distress.   HENT:  Head: Normocephalic and atraumatic.  Right Ear: External ear and ear canal normal. No drainage or swelling. Tympanic membrane is bulging. Tympanic membrane is not injected and not erythematous. A middle ear effusion is present. Decreased hearing is noted.  Left Ear: External ear and ear canal normal. No drainage or swelling. Tympanic membrane is bulging. Tympanic membrane is not injected and not erythematous. A middle ear effusion is present. Decreased hearing is noted.  Nose: Rhinorrhea present. Right sinus exhibits no maxillary sinus tenderness and no frontal sinus tenderness. Left sinus exhibits no maxillary sinus tenderness and no frontal sinus tenderness.  Mouth/Throat: Uvula is midline and mucous membranes are normal. Posterior oropharyngeal erythema present.  Eyes: Conjunctivae and EOM are normal.  Neck: Normal range of motion. Neck supple.  Cardiovascular: Normal rate, regular rhythm and normal heart sounds.  No murmur heard. Pulmonary/Chest: Effort normal and breath sounds normal. No respiratory distress. She has no decreased breath sounds. She has no wheezes. She has no rhonchi. She has no rales.  Musculoskeletal: Normal range of motion.  Lymphadenopathy:    She has no cervical adenopathy.  Neurological: She is alert and oriented to  person, place, and time. She has normal strength. Coordination and gait normal.  Skin: Skin is warm and dry.  Psychiatric: She has a normal mood and affect. Her behavior is normal. Judgment and thought content normal.  Vitals reviewed.    UC Treatments / Results  Labs (all labs ordered are listed, but only abnormal results are displayed) Labs Reviewed - No data to display  EKG None  Radiology No results found.  Procedures Procedures (including critical care time)  Medications Ordered in UC Medications - No data to display  Initial Impression / Assessment and Plan / UC Course  I have reviewed the triage vital signs and the nursing notes.  Pertinent labs & imaging results that were available during my care of the patient were reviewed by me and considered in my medical decision making (see chart for details).  Discussed with patient that she has bilateral eustachian tube dysfunction. Discussed various treatment options. Patient has had success in the past with oral steroids. Will trial Prednisone 10mg  6 day dose pack as directed. Continue to monitor sugar and blood pressure since both can be elevated with Prednisone use. Continue Flonase and Zyrtec daily. Follow-up with her PCP in 3 to 4 days if not improving.   Final Clinical Impressions(s) / UC Diagnoses   Final diagnoses:  Eustachian tube disorder, bilateral  Acute ear pain, bilateral     Discharge Instructions     Recommend start Prednisone 10mg  tablets- take 6 tablets today and decrease by 1 tablet each day until finished. Continue OTC Zyrtec 10mg  daily as directed. Follow-up with your PCP in 3 to 4 days if not improving.     ED Prescriptions    Medication Sig Dispense Auth. Provider   predniSONE (STERAPRED UNI-PAK 21 TAB) 10 MG (21) TBPK tablet Take 6 tabs by mouth daily the first day then decrease by 1 tablet each day until finished on day 6. 21 tablet Ailea Rhatigan, Nicholes Stairs, NP     Controlled Substance  Prescriptions [place] Controlled Substance Registry consulted? Not Applicable   Katy Apo, NP 02/07/18 1552

## 2018-02-06 NOTE — Discharge Instructions (Signed)
Recommend start Prednisone 10mg  tablets- take 6 tablets today and decrease by 1 tablet each day until finished. Continue OTC Zyrtec 10mg  daily as directed. Follow-up with your PCP in 3 to 4 days if not improving.

## 2018-02-06 NOTE — ED Triage Notes (Signed)
Patient stated that her ears feel full, off balance and feels like sounds are echo

## 2018-02-24 ENCOUNTER — Encounter: Payer: Self-pay | Admitting: Emergency Medicine

## 2018-02-24 ENCOUNTER — Ambulatory Visit
Admission: EM | Admit: 2018-02-24 | Discharge: 2018-02-24 | Disposition: A | Discharge status: Home / Self Care | Payer: BC Managed Care – PPO | Attending: Family Medicine | Admitting: Family Medicine

## 2018-02-24 ENCOUNTER — Other Ambulatory Visit: Payer: Self-pay

## 2018-02-24 DIAGNOSIS — R350 Frequency of micturition: Secondary | ICD-10-CM

## 2018-02-24 DIAGNOSIS — R109 Unspecified abdominal pain: Secondary | ICD-10-CM | POA: Diagnosis not present

## 2018-02-24 LAB — URINALYSIS, COMPLETE (UACMP) WITH MICROSCOPIC
Bilirubin Urine: NEGATIVE
Glucose, UA: NEGATIVE mg/dL
HGB URINE DIPSTICK: NEGATIVE
Ketones, ur: NEGATIVE mg/dL
Leukocytes, UA: NEGATIVE
Nitrite: NEGATIVE
PROTEIN: NEGATIVE mg/dL
Specific Gravity, Urine: 1.01 (ref 1.005–1.030)
pH: 7 (ref 5.0–8.0)

## 2018-02-24 NOTE — Discharge Instructions (Signed)
Rest. ° °Tylenol as needed. ° °Take care ° °Dr. Atalaya Zappia  °

## 2018-02-24 NOTE — ED Provider Notes (Signed)
[facility]    CSN: [id number] Arrival date & time: 02/24/18  1034  History   Chief Complaint Chief Complaint  Patient presents with  . Urinary Tract Infection   HPI  57 year old female presents with concerns for UTI.  Patient reports that symptoms started this morning.  Urinary frequency, pelvic pressure.  No fevers or chills.  No dysuria.  No medications changes.  She does report associated back pain.  Mild.  No known exacerbating factors.  No other associated symptoms.  No other complaints.  PMH, Surgical Hx, Family Hx, Social History reviewed and updated as below.  Past Medical History:  Diagnosis Date  . Diabetes mellitus without complication ([place])   . Hypertension    Past Surgical History:  Procedure Laterality Date  . FRACTURE SURGERY     OB History   None    Home Medications    Prior to Admission medications   Medication Sig Start Date End Date Taking? Authorizing Provider  amLODipine (NORVASC) 10 MG tablet take 1 tablet by mouth once daily 03/13/14  Yes [provider]  gabapentin (NEURONTIN) 300 MG capsule take 1 capsule by mouth at bedtime 06/10/14  Yes [provider]  lisinopril-hydrochlorothiazide (PRINZIDE,ZESTORETIC) 20-25 MG per tablet Take by mouth. 09/14/14  Yes [provider]  metFORMIN (GLUCOPHAGE-XR) 500 MG 24 hr tablet take 1 tablet by mouth once daily every morning BEFORE BREAKFAST 09/08/14  Yes [provider]   Family History Family History  Problem Relation Age of Onset  . Hypertension Mother   . Diabetes Mother   . Diabetes Father   . Hypertension Father    Social History Social History   Tobacco Use  . Smoking status: Never Smoker  . Smokeless tobacco: Never Used  Substance Use Topics  . Alcohol use: No  . Drug use: No   Allergies   Patient has no known allergies.   Review of Systems Review of Systems  Constitutional: Negative.   Gastrointestinal: Positive for abdominal pain.    Genitourinary: Positive for frequency. Negative for dysuria.   Physical Exam Triage Vital Signs ED Triage Vitals  Enc Vitals Group     BP 02/24/18 1045 120/85     Pulse Rate 02/24/18 1045 83     Resp 02/24/18 1045 17     Temp 02/24/18 1045 97.8 F (36.6 C)     Temp Source 02/24/18 1045 Oral     SpO2 02/24/18 1045 100 %     Weight 02/24/18 1044 210 lb (95.3 kg)     Height 02/24/18 1044 5\' 6"  (1.676 m)     Head Circumference --      Peak Flow --      Pain Score 02/24/18 1043 5     Pain Loc --      Pain Edu? --      Excl. in [place]? --    Updated Vital Signs BP 120/85 (BP Location: Left Arm)   Pulse 83   Temp 97.8 F (36.6 C) (Oral)   Resp 17   Ht 5\' 6"  (1.676 m)   Wt 95.3 kg   LMP  (LMP Unknown)   SpO2 100%   BMI 33.89 kg/m   Visual Acuity Right Eye Distance:   Left Eye Distance:   Bilateral Distance:    Right Eye Near:   Left Eye Near:    Bilateral Near:     Physical Exam  Constitutional: She is oriented to person, place, and time. She appears well-developed.  No distress.  Cardiovascular: Normal rate and regular rhythm.  Pulmonary/Chest: Effort normal and breath sounds normal.  Abdominal: Soft. She exhibits no distension.  Neurological: She is alert and oriented to person, place, and time.  Psychiatric: She has a normal mood and affect. Her behavior is normal.  Nursing note and vitals reviewed.  UC Treatments / Results  Labs (all labs ordered are listed, but only abnormal results are displayed) Labs Reviewed  URINALYSIS, COMPLETE (UACMP) WITH MICROSCOPIC - Abnormal; Notable for the following components:      Result Value   Bacteria, UA FEW (*)    All other components within normal limits  URINE CULTURE    EKG None  Radiology No results found.  Procedures Procedures (including critical care time)  Medications Ordered in UC Medications - No data to display  Initial Impression / Assessment and Plan / UC Course  I have reviewed the triage vital  signs and the nursing notes.  Pertinent labs & imaging results that were available during my care of the patient were reviewed by me and considered in my medical decision making (see chart for details).    57 year old female presents with urinary frequency.  Urinalysis is negative.  No evidence of UTI.  Advised supportive care.  Final Clinical Impressions(s) / UC Diagnoses   Final diagnoses:  Urinary frequency     Discharge Instructions     Rest.  Tylenol as needed.  Take care  Dr. Lacinda Axon    ED Prescriptions    None     Controlled Substance Prescriptions [place] Controlled Substance Registry consulted? Not Applicable   Coral Spikes, DO 02/24/18 1244

## 2018-02-24 NOTE — ED Triage Notes (Signed)
Pt c/o urinary frequency, pelvic pressure, night sweats, and urinary rentention. No fever, chills.

## 2018-02-26 LAB — URINE CULTURE: Culture: <10000 — AB

## 2018-06-17 ENCOUNTER — Encounter: Payer: Self-pay | Admitting: Emergency Medicine

## 2018-06-17 ENCOUNTER — Other Ambulatory Visit: Payer: Self-pay

## 2018-06-17 ENCOUNTER — Ambulatory Visit
Admission: EM | Admit: 2018-06-17 | Discharge: 2018-06-17 | Disposition: A | Discharge status: Home / Self Care | Payer: BC Managed Care – PPO | Attending: Family Medicine | Admitting: Family Medicine

## 2018-06-17 DIAGNOSIS — J0100 Acute maxillary sinusitis, unspecified: Secondary | ICD-10-CM | POA: Diagnosis not present

## 2018-06-17 DIAGNOSIS — H6501 Acute serous otitis media, right ear: Secondary | ICD-10-CM | POA: Diagnosis not present

## 2018-06-17 MED ORDER — AMOXICILLIN 875 MG PO TABS: 875.0000 mg | ORAL_TABLET | Freq: Two times a day (BID) | ORAL | 0 refills | Status: DC

## 2018-06-17 NOTE — ED Triage Notes (Signed)
Pt c/o headache, dizziness, cough, and ear fullness. Started about a week ago.

## 2018-06-17 NOTE — ED Provider Notes (Signed)
[facility]    CSN: [id number] Arrival date & time: 06/17/18  0820     History   Chief Complaint Chief Complaint  Patient presents with  . Cough    appt    HPI Lindsey Warren is a 57 y.o. female.   The history is provided by the patient.  URI  Presenting symptoms: congestion, cough, ear pain, facial pain, fatigue and rhinorrhea   Severity:  Moderate Onset quality:  Sudden Duration:  1 week Timing:  Constant Progression:  Worsening Chronicity:  New Relieved by:  Nothing Ineffective treatments:  OTC medications Associated symptoms: headaches and sinus pain   Risk factors: diabetes mellitus and sick contacts     Past Medical History:  Diagnosis Date  . Diabetes mellitus without complication ([place])   . Hypertension     There are no active problems to display for this patient.   Past Surgical History:  Procedure Laterality Date  . FRACTURE SURGERY      OB History   No obstetric history on file.      Home Medications    Prior to Admission medications   Medication Sig Start Date End Date Taking? Authorizing Provider  amLODipine (NORVASC) 10 MG tablet take 1 tablet by mouth once daily 03/13/14  Yes [provider]  gabapentin (NEURONTIN) 300 MG capsule take 1 capsule by mouth at bedtime 06/10/14  Yes [provider]  lisinopril-hydrochlorothiazide (PRINZIDE,ZESTORETIC) 20-25 MG per tablet Take by mouth. 09/14/14  Yes [provider]  metFORMIN (GLUCOPHAGE-XR) 500 MG 24 hr tablet take 1 tablet by mouth once daily every morning BEFORE BREAKFAST 09/08/14  Yes [provider]  amoxicillin (AMOXIL) 875 MG tablet Take 1 tablet (875 mg total) by mouth 2 (two) times daily. 06/17/18   Norval Gable, MD    Family History Family History  Problem Relation Age of Onset  . Hypertension Mother   . Diabetes Mother   . Diabetes Father   . Hypertension Father     Social History Social History   Tobacco Use  .  Smoking status: Never Smoker  . Smokeless tobacco: Never Used  Substance Use Topics  . Alcohol use: No  . Drug use: No     Allergies   Patient has no known allergies.   Review of Systems Review of Systems  Constitutional: Positive for fatigue.  HENT: Positive for congestion, ear pain, rhinorrhea and sinus pain.   Respiratory: Positive for cough.   Neurological: Positive for headaches.     Physical Exam Triage Vital Signs ED Triage Vitals  Enc Vitals Group     BP 06/17/18 0833 114/75     Pulse Rate 06/17/18 0833 79     Resp 06/17/18 0833 18     Temp 06/17/18 0833 98.4 F (36.9 C)     Temp Source 06/17/18 0833 Oral     SpO2 06/17/18 0833 99 %     Weight 06/17/18 0830 208 lb (94.3 kg)     Height 06/17/18 0830 5\' 6"  (1.676 m)     Head Circumference --      Peak Flow --      Pain Score 06/17/18 0830 0     Pain Loc --      Pain Edu? --      Excl. in [place]? --    No data found.  Updated Vital Signs BP 114/75 (BP Location: Left Arm)   Pulse 79   Temp 98.4 F (36.9 C) (Oral)   Resp 18  Ht 5\' 6"  (1.676 m)   Wt 94.3 kg   LMP  (LMP Unknown)   SpO2 99%   BMI 33.57 kg/m   Visual Acuity Right Eye Distance:   Left Eye Distance:   Bilateral Distance:    Right Eye Near:   Left Eye Near:    Bilateral Near:     Physical Exam Vitals signs and nursing note reviewed.  Constitutional:      General: She is not in acute distress.    Appearance: She is well-developed. She is not diaphoretic.  HENT:     Head: Normocephalic and atraumatic.     Right Ear: Ear canal and external ear normal. A middle ear effusion is present. Tympanic membrane is erythematous and bulging.     Left Ear: Tympanic membrane, ear canal and external ear normal.     Nose: Mucosal edema and rhinorrhea present. No nasal deformity, septal deviation or laceration.     Right Sinus: Maxillary sinus tenderness and frontal sinus tenderness present.     Left Sinus: Maxillary sinus tenderness and frontal  sinus tenderness present.     Mouth/Throat:     Pharynx: Uvula midline. No oropharyngeal exudate.  Eyes:     General: No scleral icterus.       Right eye: No discharge.        Left eye: No discharge.     Conjunctiva/sclera: Conjunctivae normal.     Pupils: Pupils are equal, round, and reactive to light.  Neck:     Musculoskeletal: Normal range of motion and neck supple.     Thyroid: No thyromegaly.  Cardiovascular:     Rate and Rhythm: Normal rate and regular rhythm.     Heart sounds: Normal heart sounds.  Pulmonary:     Effort: Pulmonary effort is normal. No respiratory distress.     Breath sounds: Normal breath sounds. No wheezing or rales.  Lymphadenopathy:     Cervical: No cervical adenopathy.      UC Treatments / Results  Labs (all labs ordered are listed, but only abnormal results are displayed) Labs Reviewed - No data to display  EKG None  Radiology No results found.  Procedures Procedures (including critical care time)  Medications Ordered in UC Medications - No data to display  Initial Impression / Assessment and Plan / UC Course  I have reviewed the triage vital signs and the nursing notes.  Pertinent labs & imaging results that were available during my care of the patient were reviewed by me and considered in my medical decision making (see chart for details).      Final Clinical Impressions(s) / UC Diagnoses   Final diagnoses:  Right acute serous otitis media, recurrence not specified  Acute maxillary sinusitis, recurrence not specified    ED Prescriptions    Medication Sig Dispense Auth. Provider   amoxicillin (AMOXIL) 875 MG tablet Take 1 tablet (875 mg total) by mouth 2 (two) times daily. 20 tablet Norval Gable, MD     1. diagnosis reviewed with patient 2. rx as per orders above; reviewed possible side effects, interactions, risks and benefits  3. Recommend supportive treatment with rest, fluids, otc analgesics 4. Follow-up prn if  symptoms worsen or don't improve    Controlled Substance Prescriptions [place] Controlled Substance Registry consulted? Not Applicable   Norval Gable, MD 06/17/18 1036

## 2019-06-03 ENCOUNTER — Other Ambulatory Visit: Payer: Self-pay

## 2019-06-03 DIAGNOSIS — Z20822 Contact with and (suspected) exposure to covid-19: Secondary | ICD-10-CM

## 2019-06-04 LAB — NOVEL CORONAVIRUS, NAA: SARS-CoV-2, NAA: NOT DETECTED

## 2019-07-27 ENCOUNTER — Other Ambulatory Visit (HOSPITAL_COMMUNITY)
Admission: RE | Admit: 2019-07-27 | Discharge: 2019-07-27 | Disposition: A | Discharge status: Home / Self Care | Payer: BC Managed Care – PPO | Source: Ambulatory Visit | Attending: Obstetrics & Gynecology | Admitting: Obstetrics & Gynecology

## 2019-07-27 ENCOUNTER — Other Ambulatory Visit: Payer: Self-pay

## 2019-07-27 ENCOUNTER — Encounter: Payer: Self-pay | Admitting: Obstetrics & Gynecology

## 2019-07-27 ENCOUNTER — Ambulatory Visit (INDEPENDENT_AMBULATORY_CARE_PROVIDER_SITE_OTHER): Payer: BC Managed Care – PPO | Admitting: Obstetrics & Gynecology

## 2019-07-27 VITALS — BP 128/80 | Ht 65.0 in | Wt 216.0 lb

## 2019-07-27 DIAGNOSIS — Z01411 Encounter for gynecological examination (general) (routine) with abnormal findings: Secondary | ICD-10-CM | POA: Diagnosis not present

## 2019-07-27 DIAGNOSIS — Z124 Encounter for screening for malignant neoplasm of cervix: Secondary | ICD-10-CM | POA: Diagnosis present

## 2019-07-27 DIAGNOSIS — N814 Uterovaginal prolapse, unspecified: Secondary | ICD-10-CM | POA: Diagnosis not present

## 2019-07-27 DIAGNOSIS — Z01419 Encounter for gynecological examination (general) (routine) without abnormal findings: Secondary | ICD-10-CM

## 2019-07-27 DIAGNOSIS — Z1211 Encounter for screening for malignant neoplasm of colon: Secondary | ICD-10-CM

## 2019-07-27 DIAGNOSIS — Z1231 Encounter for screening mammogram for malignant neoplasm of breast: Secondary | ICD-10-CM

## 2019-07-27 NOTE — Progress Notes (Signed)
HPI:      [name] is a 59 y.o. G21P2 AA F whose LMP was in the past, she presents today for her annual examination.  The patient has no complaints today OTHER THAN vaginal pressure at times recently, normal bladder and bowel habits, and no bleeding.  She has not recently been sexually active (husband prostate surgery).  She has h/o vaginal sling procedure 20 years ago.  Herlast pap: approximate date many years ago and was normal and last mammogram: patient has never had a mammogram.  The patient does perform self breast exams.  There is no notable family history of breast or ovarian cancer in her family. The patient is not taking hormone replacement therapy. Patient denies post-menopausal vaginal bleeding.   The patient has regular exercise: yes. The patient denies current symptoms of depression.    GYN Hx: Last Colonoscopy:never ago.  PMHx: Past Medical History:  Diagnosis Date  . Diabetes mellitus without complication ([place])   . Hypertension    Past Surgical History:  Procedure Laterality Date  . FRACTURE SURGERY     Family History  Problem Relation Age of Onset  . Hypertension Mother   . Diabetes Mother   . Diabetes Father   . Hypertension Father    Social History   Tobacco Use  . Smoking status: Never Smoker  . Smokeless tobacco: Never Used  Substance Use Topics  . Alcohol use: No  . Drug use: No    Current Outpatient Medications:  .  gabapentin (NEURONTIN) 300 MG capsule, take 1 capsule by mouth at bedtime, Disp: , Rfl:  .  lisinopril-hydrochlorothiazide (PRINZIDE,ZESTORETIC) 20-25 MG per tablet, Take by mouth., Disp: , Rfl:  .  metFORMIN (GLUCOPHAGE-XR) 500 MG 24 hr tablet, take 1 tablet by mouth once daily every morning BEFORE BREAKFAST, Disp: , Rfl:  .  amLODipine (NORVASC) 10 MG tablet, take 1 tablet by mouth once daily, Disp: , Rfl:  .  amoxicillin (AMOXIL) 875 MG tablet, Take 1 tablet (875 mg total) by mouth 2 (two) times daily. (Patient not taking:  Reported on 07/27/2019), Disp: 20 tablet, Rfl: 0 Allergies: Patient has no known allergies.  Review of Systems  Constitutional: Negative for chills, fever and malaise/fatigue.  HENT: Negative for congestion, sinus pain and sore throat.   Eyes: Negative for blurred vision and pain.  Respiratory: Negative for cough and wheezing.   Cardiovascular: Negative for chest pain and leg swelling.  Gastrointestinal: Negative for abdominal pain, constipation, diarrhea, heartburn, nausea and vomiting.  Genitourinary: Negative for dysuria, frequency, hematuria and urgency.  Musculoskeletal: Negative for back pain, joint pain, myalgias and neck pain.  Skin: Negative for itching and rash.  Neurological: Negative for dizziness, tremors and weakness.  Endo/Heme/Allergies: Does not bruise/bleed easily.  Psychiatric/Behavioral: Negative for depression. The patient is not nervous/anxious and does not have insomnia.     Objective: BP 128/80   Ht 5\' 5"  (1.651 m)   Wt 216 lb (98 kg)   LMP  (LMP Unknown)   BMI 35.94 kg/m   Filed Weights   07/27/19 0756  Weight: 216 lb (98 kg)   Body mass index is 35.94 kg/m. Physical Exam Constitutional:      General: She is not in acute distress.    Appearance: She is well-developed.  Genitourinary:     Pelvic exam was performed with patient supine.     Vagina, uterus and rectum normal.     No lesions in the vagina.     No vaginal  bleeding.     No cervical motion tenderness, friability, lesion or polyp.     Uterus is mobile.     Uterus is not enlarged.     No uterine mass detected.    Uterus is midaxial.     No right or left adnexal mass present.     Right adnexa not tender.     Left adnexa not tender.     Genitourinary Comments: Mild uterine prolapse No cystocele, and sling area felt intact and without erosion  HENT:     Head: Normocephalic and atraumatic. No laceration.     Right Ear: Hearing normal.     Left Ear: Hearing normal.     Mouth/Throat:      Pharynx: Uvula midline.  Eyes:     Pupils: Pupils are equal, round, and reactive to light.  Neck:     Thyroid: No thyromegaly.  Cardiovascular:     Rate and Rhythm: Normal rate and regular rhythm.     Heart sounds: No murmur. No friction rub. No gallop.   Pulmonary:     Effort: Pulmonary effort is normal. No respiratory distress.     Breath sounds: Normal breath sounds. No wheezing.  Chest:     Breasts:        Right: No mass, skin change or tenderness.        Left: No mass, skin change or tenderness.  Abdominal:     General: Bowel sounds are normal. There is no distension.     Palpations: Abdomen is soft.     Tenderness: There is no abdominal tenderness. There is no rebound.  Musculoskeletal:        General: Normal range of motion.     Cervical back: Normal range of motion and neck supple.  Neurological:     Mental Status: She is alert and oriented to person, place, and time.     Cranial Nerves: No cranial nerve deficit.  Skin:    General: Skin is warm and dry.  Psychiatric:        Judgment: Judgment normal.  Vitals reviewed.     Assessment: Annual Exam 1. Women's annual routine gynecological examination   2. Encounter for screening mammogram for malignant neoplasm of breast   3. Screen for colon cancer   4. Screening for cervical cancer     Plan:            1.  Cervical Screening-  Pap smear done today  2. Breast screening- Exam annually and mammogram scheduled/encouraged soon at [facility].  3. Colonoscopy every 10 years, Hemoccult testing after age 10. Both scheduled today.  4. Labs managed by PCP  5. Counseling for hormonal therapy: none              6. FRAX - FRAX score for assessing the 10 year probability for fracture calculated and discussed today.  Based on age and score today, DEXA is not scheduled.   7. Pelvic organ prolapse, mild, discussed today.  Exercise as preventative approach.  Pessary and surgery (hysterectomy) options discussed if progressively  worsens.    F/U  Return in about 1 year (around 07/26/2020) for Annual.  Barnett Applebaum, MD, [facility], [facility] 07/27/2019  8:20 AM

## 2019-07-27 NOTE — Patient Instructions (Addendum)
PAP every three years Mammogram every year    Call [phone] to schedule at [facility] Colonoscopy every 10 years Labs yearly (with PCP)  Primary Care in the area  This is not meant to be comprehensive list, but a resource for some providers that you can call for counseling or medication needs. If you have a recommendation, please let us know.   [facility]:  [phone]               Dr Miguel Aschoff               Dr Juanetta Beets               Dr Lavon Paganini  [facility]:  [phone]  Dr Drue Stager [facility], [place]:   [place], MD  Waunita Schooner, MD  Ria Bush, MD  Jinny Sanders, MD  [facility], [place]: [phone]  Orland Mustard, MD

## 2019-07-29 LAB — CYTOLOGY - PAP
Adequacy: ABSENT
Comment: NEGATIVE
Diagnosis: NEGATIVE
High risk HPV: NEGATIVE

## 2019-08-08 ENCOUNTER — Telehealth: Payer: Self-pay | Admitting: Gastroenterology

## 2019-08-08 NOTE — Telephone Encounter (Signed)
Patient called & l/m on v/m to schedule a colonoscopy . Returning Michelle's call.

## 2019-08-09 ENCOUNTER — Other Ambulatory Visit: Payer: Self-pay

## 2019-08-09 DIAGNOSIS — Z1211 Encounter for screening for malignant neoplasm of colon: Secondary | ICD-10-CM

## 2019-08-09 NOTE — Telephone Encounter (Signed)
Gastroenterology Pre-Procedure Review  Request Date: Monday 08/22/19 Requesting Physician: Napoleon Form  PATIENT REVIEW QUESTIONS: The patient responded to the following health history questions as indicated:    1. Are you having any GI issues? no 2. Do you have a personal history of Polyps? no 3. Do you have a family history of Colon Cancer or Polyps? yes (brother colon polyps) 4. Diabetes Mellitus? yes (oral meds) 5. Joint replacements in the past 12 months?no 6. Major health problems in the past 3 months?no 7. Any artificial heart valves, MVP, or defibrillator?no    MEDICATIONS & ALLERGIES:    Patient reports the following regarding taking any anticoagulation/antiplatelet therapy:   Plavix, Coumadin, Eliquis, Xarelto, Lovenox, Pradaxa, Brilinta, or Effient? no Aspirin? no  Patient confirms/reports the following medications:  Current Outpatient Medications  Medication Sig Dispense Refill  . amLODipine (NORVASC) 10 MG tablet take 1 tablet by mouth once daily    . amoxicillin (AMOXIL) 875 MG tablet Take 1 tablet (875 mg total) by mouth 2 (two) times daily. (Patient not taking: Reported on 07/27/2019) 20 tablet 0  . gabapentin (NEURONTIN) 300 MG capsule take 1 capsule by mouth at bedtime    . lisinopril-hydrochlorothiazide (PRINZIDE,ZESTORETIC) 20-25 MG per tablet Take by mouth.    . metFORMIN (GLUCOPHAGE-XR) 500 MG 24 hr tablet take 1 tablet by mouth once daily every morning BEFORE BREAKFAST     No current facility-administered medications for this visit.    Patient confirms/reports the following allergies:  No Known Allergies  No orders of the defined types were placed in this encounter.   AUTHORIZATION INFORMATION Primary Insurance: 1D#: Group #:  Secondary Insurance: 1D#: Group #:  SCHEDULE INFORMATION: Date: Monday 08/22/19 Time: Location:[facility]

## 2019-08-18 ENCOUNTER — Other Ambulatory Visit: Payer: Self-pay

## 2019-08-18 ENCOUNTER — Other Ambulatory Visit
Admission: RE | Admit: 2019-08-18 | Discharge: 2019-08-18 | Disposition: A | Discharge status: Home / Self Care | Payer: BC Managed Care – PPO | Source: Ambulatory Visit | Attending: Gastroenterology | Admitting: Gastroenterology

## 2019-08-18 DIAGNOSIS — Z20822 Contact with and (suspected) exposure to covid-19: Secondary | ICD-10-CM | POA: Insufficient documentation

## 2019-08-18 DIAGNOSIS — Z01812 Encounter for preprocedural laboratory examination: Secondary | ICD-10-CM | POA: Diagnosis not present

## 2019-08-19 LAB — SARS CORONAVIRUS 2 (TAT 6-24 HRS): SARS Coronavirus 2: NEGATIVE

## 2019-08-22 ENCOUNTER — Ambulatory Visit: Payer: BC Managed Care – PPO | Admitting: Registered Nurse

## 2019-08-22 ENCOUNTER — Encounter: Payer: Self-pay | Admitting: Gastroenterology

## 2019-08-22 ENCOUNTER — Other Ambulatory Visit: Payer: Self-pay

## 2019-08-22 ENCOUNTER — Encounter
Admission: RE | Disposition: A | Discharge status: Home / Self Care | Payer: Self-pay | Source: Home / Self Care | Attending: Gastroenterology

## 2019-08-22 ENCOUNTER — Ambulatory Visit
Admission: RE | Admit: 2019-08-22 | Discharge: 2019-08-22 | Disposition: A | Discharge status: Home / Self Care | Payer: BC Managed Care – PPO | Attending: Gastroenterology | Admitting: Gastroenterology

## 2019-08-22 DIAGNOSIS — Z79899 Other long term (current) drug therapy: Secondary | ICD-10-CM | POA: Diagnosis not present

## 2019-08-22 DIAGNOSIS — D122 Benign neoplasm of ascending colon: Secondary | ICD-10-CM | POA: Insufficient documentation

## 2019-08-22 DIAGNOSIS — K635 Polyp of colon: Secondary | ICD-10-CM | POA: Diagnosis not present

## 2019-08-22 DIAGNOSIS — E119 Type 2 diabetes mellitus without complications: Secondary | ICD-10-CM | POA: Diagnosis not present

## 2019-08-22 DIAGNOSIS — Z1211 Encounter for screening for malignant neoplasm of colon: Secondary | ICD-10-CM | POA: Insufficient documentation

## 2019-08-22 DIAGNOSIS — I10 Essential (primary) hypertension: Secondary | ICD-10-CM | POA: Insufficient documentation

## 2019-08-22 DIAGNOSIS — Z7984 Long term (current) use of oral hypoglycemic drugs: Secondary | ICD-10-CM | POA: Diagnosis not present

## 2019-08-22 HISTORY — PX: COLONOSCOPY WITH PROPOFOL: SHX5780

## 2019-08-22 LAB — GLUCOSE, CAPILLARY: Glucose-Capillary: 106 mg/dL — ABNORMAL HIGH (ref 70–99)

## 2019-08-22 SURGERY — COLONOSCOPY WITH PROPOFOL
Anesthesia: General

## 2019-08-22 MED ORDER — GLYCOPYRROLATE 0.2 MG/ML IJ SOLN
INTRAMUSCULAR | Status: DC | PRN
  Administered 2019-08-22: .2 mg via INTRAVENOUS

## 2019-08-22 MED ORDER — PROPOFOL 10 MG/ML IV BOLUS
INTRAVENOUS | Status: AC
  Filled 2019-08-22: qty 20

## 2019-08-22 MED ORDER — GLYCOPYRROLATE 0.2 MG/ML IJ SOLN
INTRAMUSCULAR | Status: AC
  Filled 2019-08-22: qty 1

## 2019-08-22 MED ORDER — SODIUM CHLORIDE 0.9 % IV SOLN: INTRAVENOUS | Status: DC

## 2019-08-22 MED ORDER — LIDOCAINE HCL (CARDIAC) PF 100 MG/5ML IV SOSY
PREFILLED_SYRINGE | INTRAVENOUS | Status: DC | PRN
  Administered 2019-08-22: 100 mg via INTRAVENOUS

## 2019-08-22 MED ORDER — PROPOFOL 500 MG/50ML IV EMUL
INTRAVENOUS | Status: DC | PRN
  Administered 2019-08-22: 120 ug/kg/min via INTRAVENOUS

## 2019-08-22 MED ORDER — PROPOFOL 10 MG/ML IV BOLUS
INTRAVENOUS | Status: DC | PRN
  Administered 2019-08-22: 20 mg via INTRAVENOUS
  Administered 2019-08-22: 80 mg via INTRAVENOUS

## 2019-08-22 NOTE — Op Note (Signed)
[facility] Gastroenterology Patient Name: Lindsey Warren Procedure Date: 08/22/2019 9:35 AM MRN: [id number] Account #: [id number] Date of Birth: 1961-01-28 Admit Type: Outpatient Age: 59 Room: [facility] ENDO ROOM 1 Gender: Female Note Status: Finalized Procedure:             Colonoscopy Indications:           Screening for colorectal malignant neoplasm Providers:             Jonathon Bellows MD, MD Medicines:             Monitored Anesthesia Care Complications:         No immediate complications. Procedure:             Pre-Anesthesia Assessment:                        - Prior to the procedure, a History and Physical was                         performed, and patient medications, allergies and                         sensitivities were reviewed. The patient's tolerance                         of previous anesthesia was reviewed.                        - The risks and benefits of the procedure and the                         sedation options and risks were discussed with the                         patient. All questions were answered and informed                         consent was obtained.                        - After reviewing the risks and benefits, the patient                         was deemed in satisfactory condition to undergo the                         procedure.                        - ASA Grade Assessment: II - A patient with mild                         systemic disease.                        After obtaining informed consent, the colonoscope was                         passed under direct vision. Throughout the procedure,  the patient's blood pressure, pulse, and oxygen                         saturations were monitored continuously. The                         Colonoscope was introduced through the anus and                         advanced to the the cecum, identified by the                         appendiceal orifice. The  colonoscopy was performed                         with ease. The patient tolerated the procedure well.                         The quality of the bowel preparation was excellent. Findings:      The perianal and digital rectal examinations were normal.      A 3 mm polyp was found in the ascending colon. The polyp was sessile.       The polyp was removed with a cold biopsy forceps. Resection and       retrieval were complete.      The exam was otherwise without abnormality on direct and retroflexion       views. Impression:            - One 3 mm polyp in the ascending colon, removed with                         a cold biopsy forceps. Resected and retrieved.                        - The examination was otherwise normal on direct and                         retroflexion views. Recommendation:        - Discharge patient to home (with escort).                        - Resume previous diet.                        - Continue present medications.                        - Await pathology results.                        - Repeat colonoscopy for surveillance based on                         pathology results. Procedure Code(s):     --- Professional ---                        [phone], Colonoscopy, flexible; with biopsy, single or  multiple Diagnosis Code(s):     --- Professional ---                        Z12.11, Encounter for screening for malignant neoplasm                         of colon                        K63.5, Polyp of colon CPT copyright 2019 American Medical Association. All rights reserved. The codes documented in this report are preliminary and upon coder review may  be revised to meet current compliance requirements. Jonathon Bellows, MD Jonathon Bellows MD, MD 08/22/2019 11:27:46 AM This report has been signed electronically. Number of Addenda: 0 Note Initiated On: 08/22/2019 9:35 AM Scope Withdrawal Time: 0 hours 10 minutes 11 seconds  Total Procedure Duration: 0 hours  15 minutes 50 seconds  Estimated Blood Loss:  Estimated blood loss: none.      [facility]

## 2019-08-22 NOTE — Anesthesia Postprocedure Evaluation (Signed)
Anesthesia Post Note  Patient: Lindsey Warren  Procedure(s) Performed: COLONOSCOPY WITH PROPOFOL (N/A )  Patient location during evaluation: PACU Anesthesia Type: General Level of consciousness: awake and alert Pain management: pain level controlled Vital Signs Assessment: post-procedure vital signs reviewed and stable Respiratory status: spontaneous breathing, nonlabored ventilation and respiratory function stable Cardiovascular status: blood pressure returned to baseline and stable Postop Assessment: no apparent nausea or vomiting Anesthetic complications: no     Last Vitals:  Vitals:   08/22/19 1141 08/22/19 1151  BP: 108/79 121/71  Pulse: 84 88  Resp: 18 18  Temp:    SpO2: 98% 94%    Last Pain:  Vitals:   08/22/19 1151  TempSrc:   PainSc: 0-No pain                 Tera Mater

## 2019-08-22 NOTE — Anesthesia Preprocedure Evaluation (Addendum)
Anesthesia Evaluation  Patient identified by MRN, date of birth, ID band Patient awake    Reviewed: Allergy & Precautions, H&P , NPO status , Patient's Chart, lab work & pertinent test results  History of Anesthesia Complications Negative for: history of anesthetic complications  Airway Mallampati: II  TM Distance: >3 FB Neck ROM: full    Dental  (+) Partial Upper, Missing   Pulmonary neg pulmonary ROS, neg shortness of breath, neg COPD, neg recent URI,           Cardiovascular hypertension, (-) angina(-) Past MI and (-) Cardiac Stents (-) dysrhythmias      Neuro/Psych negative neurological ROS  negative psych ROS   GI/Hepatic negative GI ROS, Neg liver ROS,   Endo/Other  diabetes  Renal/GU negative Renal ROS  negative genitourinary   Musculoskeletal   Abdominal   Peds  Hematology negative hematology ROS (+)   Anesthesia Other Findings Past Medical History: No date: Diabetes mellitus without complication (HCC) No date: Hypertension  Past Surgical History: No date: FRACTURE SURGERY  BMI    Body Mass Index: 35.94 kg/m      Reproductive/Obstetrics negative OB ROS                            Anesthesia Physical Anesthesia Plan  ASA: II  Anesthesia Plan: General   Post-op Pain Management:    Induction:   PONV Risk Score and Plan: Propofol infusion and TIVA  Airway Management Planned: Natural Airway and Nasal Cannula  Additional Equipment:   Intra-op Plan:   Post-operative Plan:   Informed Consent: I have reviewed the patients History and Physical, chart, labs and discussed the procedure including the risks, benefits and alternatives for the proposed anesthesia with the patient or authorized representative who has indicated his/her understanding and acceptance.     Dental Advisory Given  Plan Discussed with: Anesthesiologist  Anesthesia Plan Comments:          Anesthesia Quick Evaluation

## 2019-08-22 NOTE — H&P (Signed)
Jonathon Bellows, MD [address], [place], [place], [place], [id number], [place], [place], [place], [address] Phone: [phone]  Fax: [phone]  Primary Care Physician:  Kathee Polite, MD   Pre-Procedure History & Physical: HPI:  PERSAEUS MAHADEVAN is a 59 y.o. female is here for an colonoscopy.   Past Medical History:  Diagnosis Date  . Diabetes mellitus without complication ([facility])   . Hypertension     Past Surgical History:  Procedure Laterality Date  . FRACTURE SURGERY      Prior to Admission medications   Medication Sig Start Date End Date Taking? Authorizing Provider  amLODipine (NORVASC) 10 MG tablet take 1 tablet by mouth once daily 03/13/14  Yes [provider]  gabapentin (NEURONTIN) 300 MG capsule take 1 capsule by mouth at bedtime 06/10/14  Yes [provider]  lisinopril-hydrochlorothiazide (PRINZIDE,ZESTORETIC) 20-25 MG per tablet Take by mouth. 09/14/14  Yes [provider]  metFORMIN (GLUCOPHAGE-XR) 500 MG 24 hr tablet take 1 tablet by mouth once daily every morning BEFORE BREAKFAST 09/08/14  Yes [provider]  amoxicillin (AMOXIL) 875 MG tablet Take 1 tablet (875 mg total) by mouth 2 (two) times daily. Patient not taking: Reported on 07/27/2019 06/17/18   Norval Gable, MD    Allergies as of 08/10/2019  . (No Known Allergies)    Family History  Problem Relation Age of Onset  . Hypertension Mother   . Diabetes Mother   . Diabetes Father   . Hypertension Father     Social History   Socioeconomic History  . Marital status: Married    Spouse name: Not on file  . Number of children: Not on file  . Years of education: Not on file  . Highest education level: Not on file  Occupational History  . Not on file  Tobacco Use  . Smoking status: Never Smoker  . Smokeless tobacco: Never Used  Substance and Sexual Activity  . Alcohol use: No  . Drug use: No  . Sexual activity: Not on file    Other Topics Concern  . Not on file  Social History Narrative  . Not on file   Social Determinants of Health   Financial Resource Strain:   . Difficulty of Paying Living Expenses: Not on file  Food Insecurity:   . Worried About Charity fundraiser in the Last Year: Not on file  . Ran Out of Food in the Last Year: Not on file  Transportation Needs:   . Lack of Transportation (Medical): Not on file  . Lack of Transportation (Non-Medical): Not on file  Physical Activity:   . Days of Exercise per Week: Not on file  . Minutes of Exercise per Session: Not on file  Stress:   . Feeling of Stress : Not on file  Social Connections:   . Frequency of Communication with Friends and Family: Not on file  . Frequency of Social Gatherings with Friends and Family: Not on file  . Attends Religious Services: Not on file  . Active Member of Clubs or Organizations: Not on file  . Attends Archivist Meetings: Not on file  . Marital Status: Not on file  Intimate Partner Violence:   . Fear of Current or Ex-Partner: Not on file  . Emotionally Abused: Not on file  . Physically Abused: Not on file  . Sexually Abused: Not on file    Review of Systems: See HPI, otherwise negative ROS  Physical Exam: BP (!) 141/79   Pulse 77   Temp 98.1 F (36.7 C) (Temporal)   Resp 16   Ht 5\' 5"  (1.651 m)   Wt 98 kg   LMP  (LMP Unknown)   SpO2 100%   BMI 35.94 kg/m  General:   Alert,  pleasant and cooperative in NAD Head:  Normocephalic and atraumatic. Neck:  Supple; no masses or thyromegaly. Lungs:  Clear throughout to auscultation, normal respiratory effort.    Heart:  +S1, +S2, Regular rate and rhythm, No edema. Abdomen:  Soft, nontender and nondistended. Normal bowel sounds, without guarding, and without rebound.   Neurologic:  Alert and  oriented x4;  grossly normal neurologically.  Impression/Plan: JENESSY KUNZLER is here for an colonoscopy to be performed for Screening colonoscopy  average risk   Risks, benefits, limitations, and alternatives regarding  colonoscopy have been reviewed with the patient.  Questions have been answered.  All parties agreeable.   Jonathon Bellows, MD  08/22/2019, 10:55 AM

## 2019-08-22 NOTE — Transfer of Care (Signed)
Immediate Anesthesia Transfer of Care Note  Patient: Lindsey Warren  Procedure(s) Performed: COLONOSCOPY WITH PROPOFOL (N/A )  Patient Location: Endoscopy Unit  Anesthesia Type:General  Level of Consciousness: drowsy  Airway & Oxygen Therapy: Patient Spontanous Breathing  Post-op Assessment: Report given to RN and Post -op Vital signs reviewed and stable  Post vital signs: Reviewed and stable  Last Vitals:  Vitals Value Taken Time  BP 111/76   Temp    Pulse 96   Resp 16   SpO2 100%     Last Pain:  Vitals:   08/22/19 0939  TempSrc: Temporal  PainSc: 0-No pain         Complications: No apparent anesthesia complications

## 2019-08-23 ENCOUNTER — Encounter: Payer: Self-pay | Admitting: *Deleted

## 2019-08-23 LAB — SURGICAL PATHOLOGY

## 2019-09-08 ENCOUNTER — Encounter: Payer: Self-pay | Admitting: Gastroenterology

## 2019-10-20 ENCOUNTER — Telehealth: Payer: Self-pay

## 2019-10-20 NOTE — Telephone Encounter (Signed)
-----   Message from Gae Dry, MD sent at 10/19/2019  7:07 AM EDT ----- Regarding: MMG Received notice she has not received MMG yet as ordered at her Annual. Please check and encourage her to do this, and document conversation.

## 2019-10-20 NOTE — Telephone Encounter (Signed)
Pt aware to schedule her mammogram

## 2020-01-19 ENCOUNTER — Other Ambulatory Visit: Payer: Self-pay | Admitting: Obstetrics & Gynecology

## 2020-01-19 DIAGNOSIS — Z1231 Encounter for screening mammogram for malignant neoplasm of breast: Secondary | ICD-10-CM

## 2020-01-29 ENCOUNTER — Other Ambulatory Visit: Payer: Self-pay

## 2020-01-29 ENCOUNTER — Ambulatory Visit
Admission: RE | Admit: 2020-01-29 | Discharge: 2020-01-29 | Disposition: A | Discharge status: Home / Self Care | Payer: BC Managed Care – PPO | Source: Ambulatory Visit | Attending: Emergency Medicine | Admitting: Emergency Medicine

## 2020-01-29 VITALS — BP 131/69 | HR 98 | Temp 98.9°F | Resp 16 | Ht 65.0 in | Wt 216.1 lb

## 2020-01-29 DIAGNOSIS — S8980XA Other specified injuries of unspecified lower leg, initial encounter: Secondary | ICD-10-CM | POA: Diagnosis not present

## 2020-01-29 MED ORDER — TIZANIDINE HCL 4 MG PO TABS: 4.0000 mg | ORAL_TABLET | Freq: Three times a day (TID) | ORAL | 0 refills | Status: DC | PRN

## 2020-01-29 MED ORDER — IBUPROFEN 600 MG PO TABS: 600.0000 mg | ORAL_TABLET | Freq: Four times a day (QID) | ORAL | 0 refills | Status: AC | PRN

## 2020-01-29 NOTE — Discharge Instructions (Addendum)
I suspect this is from overuse.  Try the Zanaflex which is a muscle relaxant, discontinue the Mobic.  Take 600 mg of ibuprofen combined with the 1000 mg Tylenol together 3-4 times a day as needed for pain.  Warm or cool compresses, whichever feels better.  Follow-up with sports medicine in [place] or with [facility] if not getting any better in about a week.

## 2020-01-29 NOTE — ED Triage Notes (Signed)
Patient complains of right leg pain x 2 days. Patient currently limping down the hall. States that she had this a few weeks ago and saw her PCP and they told her it was a pulled muscle but the pain has returned. Patient states that right calf feels very tight.

## 2020-01-29 NOTE — ED Provider Notes (Signed)
HPI  SUBJECTIVE:  Lindsey Warren is a 59 y.o. female who presents with 2 days of right lateral calf pain.  States that she recently took up walking and rapidly increased her mileage.  She had symptoms like this several weeks ago, saw her primary care physician, thought to have an LCL strain, was started on meloxicam, and the symptoms resolved.  She states that the symptoms returned 2 days ago.  She describes the pain as intermittent, lasting minutes tightening up, sharp with rotation.  No erythema, increased temperature, trauma to the area.  She denies anterior medial or lateral knee pain.  She denies medial thigh pain, chest pain, shortness of breath, hemoptysis.  She has tried Tylenol and meloxicam without improvement in her symptoms.  Symptoms are worse with walking.  No recent immobilization, surgery in the past 4 weeks.  She has a past medical history of a right foot fusion and "walks on her heel".  No history of diabetes, hypertension, neuropathy, cancer, Baker's cyst, DVT, PE, hypercoagulability, chronic kidney disease, thrombocytopenia.  PMD:O'Meara, Elnora Morrison, MD   Past Medical History:  Diagnosis Date  . Diabetes mellitus without complication ([place])   . Hypertension     Past Surgical History:  Procedure Laterality Date  . COLONOSCOPY WITH PROPOFOL N/A 08/22/2019   Procedure: COLONOSCOPY WITH PROPOFOL;  Surgeon: Jonathon Bellows, MD;  Location[facility] ENDOSCOPY;  Service: Gastroenterology;  Laterality: N/A;  . FRACTURE SURGERY      Family History  Problem Relation Age of Onset  . Hypertension Mother   . Diabetes Mother   . Diabetes Father   . Hypertension Father     Social History   Tobacco Use  . Smoking status: Never Smoker  . Smokeless tobacco: Never Used  Vaping Use  . Vaping Use: Never used  Substance Use Topics  . Alcohol use: No  . Drug use: No    No current facility-administered medications for this encounter.  Current Outpatient Medications:  .  amLODipine  (NORVASC) 10 MG tablet, take 1 tablet by mouth once daily, Disp: , Rfl:  .  lisinopril-hydrochlorothiazide (PRINZIDE,ZESTORETIC) 20-25 MG per tablet, Take by mouth., Disp: , Rfl:  .  metFORMIN (GLUCOPHAGE-XR) 500 MG 24 hr tablet, take 1 tablet by mouth once daily every morning BEFORE BREAKFAST, Disp: , Rfl:  .  ibuprofen (ADVIL) 600 MG tablet, Take 1 tablet (600 mg total) by mouth every 6 (six) hours as needed., Disp: 30 tablet, Rfl: 0 .  tiZANidine (ZANAFLEX) 4 MG tablet, Take 1 tablet (4 mg total) by mouth every 8 (eight) hours as needed for muscle spasms., Disp: 30 tablet, Rfl: 0  No Known Allergies   ROS  As noted in HPI.   Physical Exam  BP 131/69 (BP Location: Left Arm)   Pulse 98   Temp 98.9 F (37.2 C) (Oral)   Resp 16   Ht 5\' 5"  (1.651 m)   Wt 98 kg   LMP  (LMP Unknown)   SpO2 99%   BMI 35.95 kg/m   Constitutional: Well developed, well nourished, no acute distress Eyes:  EOMI, conjunctiva normal bilaterally HENT: Normocephalic, atraumatic,mucus membranes moist Respiratory: Normal inspiratory effort Cardiovascular: Normal rate GI: nondistended skin: No rash, skin intact Musculoskeletal: Right calf 40.5 cm, left calf 41 cm.  Positive tenderness along the lateral gastrocnemius, along the outer part of the leg.  No midline calf tenderness.  No medial calf tenderness.  No anterior tibialis tenderness.  No knee tenderness.  No knee pain with full  passive range of motion.  Knee stable on varus/valgus stress.  Negative McMurray.  No edema.  PT 2+ .  Sensation intact.  No tenderness along the medial thigh. Neurologic: Alert & oriented x 3, no focal neuro deficits Psychiatric: Speech and behavior appropriate   ED Course   Medications - No data to display  No orders of the defined types were placed in this encounter.   No results found for this or any previous visit (from the past 24 hour(s)). No results found.  ED Clinical Impression  1. Overuse injury of lower leg       ED Assessment/Plan  Outside labs, records reviewed.  Patient was seen by PMD on 7/14 thought to have an LCL strain.  She was sent home with meloxicam 15 mg daily.  Lab work from 7/17 showed normal BUN/creatinine.  Doubt DVT given the location of the tenderness.  Suspect overuse injury from rapidly increasing her miles with her abnormal gait due to the right foot fusion.  Her calves are symmetric.  She has no risk factors for DVT.  We will try Zanaflex, discontinue Mobic, ibuprofen 600 mg combined with 1000 mg of Tylenol 3-4 times a day as needed for pain.  Follow-up with sports medicine or [facility].  Giving information for [facility].   Discussed labs,  MDM, treatment plan, and plan for follow-up with patient. Discussed sn/sx that should prompt return to the ED. patient agrees with plan.   Meds ordered this encounter  Medications  . ibuprofen (ADVIL) 600 MG tablet    Sig: Take 1 tablet (600 mg total) by mouth every 6 (six) hours as needed.    Dispense:  30 tablet    Refill:  0  . tiZANidine (ZANAFLEX) 4 MG tablet    Sig: Take 1 tablet (4 mg total) by mouth every 8 (eight) hours as needed for muscle spasms.    Dispense:  30 tablet    Refill:  0    *This clinic note was created using [employer]. Therefore, there may be occasional mistakes despite careful proofreading.   ?    Melynda Ripple, MD 01/31/20 0600

## 2020-02-01 ENCOUNTER — Other Ambulatory Visit: Payer: Self-pay

## 2020-02-01 ENCOUNTER — Encounter: Payer: Self-pay | Admitting: Family Medicine

## 2020-02-01 ENCOUNTER — Ambulatory Visit: Payer: BC Managed Care – PPO | Admitting: Family Medicine

## 2020-02-01 DIAGNOSIS — M25561 Pain in right knee: Secondary | ICD-10-CM | POA: Diagnosis not present

## 2020-02-01 NOTE — Assessment & Plan Note (Signed)
Given history, clinical presentation, and exam today she most likely has a combination of a lateral gastrocnemius strain in addition with a injury to the lateral meniscus.  Will treat with conservative management.  Most likely this injury occurred due to a combination of increased activity and also compensation from the previous right foot fusion altering her gait. - Home exercises for the gastroc emphasis on lateral strengthening - She can continue ibuprofen 600 mg as needed as she was given from urgent care -Patient fitted with compression sleeve and encouraged to wear while active -Follow-up in 6 weeks

## 2020-02-01 NOTE — Patient Instructions (Signed)
It was great to meet you today! Thank you for letting me participate in your care!  Today, we discussed your leg pain which is due to a strain of the lateral gastrocnemius muscle (also known as the calf muscle). You likely also have an injury to the lateral meniscus. Thankfully, these are both treated conservatively. Please continue to walk but avoid going up stairs and steep inclines. Let pain be your guide. Please do the home exercises as well as this will help you get stronger faster. Continue using the Ibuprofen you were given from the Urgent Care. Only wear the compression sleeve when you are active.  Be well, Lindsey Rutherford, DO PGY-4, Sports Medicine Fellow [place]

## 2020-02-01 NOTE — Progress Notes (Signed)
    SUBJECTIVE:   CHIEF COMPLAINT / HPI:   Right knee pain and right calf pain [name] is a pleasant 59 year old female who is a new patient to this practice presenting today for follow-up after being seen in the urgent care for right knee pain.  He states that most recently she has started trying to exercise and increase her daily activity by walking.  Just last Sunday she was going down some steps at church and felt like her knee was going to "give out" and had a very sharp pain on the outside of her knee that radiated down the outside of her leg in the calf region.  She states that she had a fusion of her right foot approximately 6 to 7 years ago where several screws were placed and she has since needed to compensate in her gait for this.  The pain seems to be better if she avoids going up stairs or pushing off of her right foot or any kind of twisting movements of the knee.  She remembers no swelling, no bruising, no popping but did feel like it was going to give out.  PERTINENT  PMH / PSH: Right foot fusion  OBJECTIVE:   BP 132/75   Ht 5\' 6"  (1.676 m)   Wt 226 lb (102.5 kg)   LMP  (LMP Unknown)   BMI 36.48 kg/m   MSK: Knee, Right: Inspection was negative for erythema, ecchymosis, and effusion. No obvious bony abnormalities or signs of osteophyte development. Palpation yielded no asymmetric warmth; Positive for joint line tenderness on the lateral side and distal biceps femoris tenderness; No condyle tenderness; No patellar tenderness; No patellar crepitus. Patellar and quadriceps tendons unremarkable, and no tenderness of the pes anserine bursa. No obvious Baker's cyst development. Active ROM normal in flexion and extension. Normal hamstring and quadriceps strength. Neurovascularly intact bilaterally. Special Tests  - Cruciate Ligaments:   - Anterior Drawer:  NEG - Posterior Drawer: NEG   - Lachman:  NEG  - Collateral Ligaments:   - Varus/Valgus Stress test: NEG  -  Meniscus:   - Thessaly: Positive   - McMurray's: Equivocal  - Patella:   - Patellar grind/compression: NEG   ASSESSMENT/PLAN:   Right knee pain Given history, clinical presentation, and exam today she most likely has a combination of a lateral gastrocnemius strain in addition with an injury to the lateral meniscus.  Will treat with conservative management.  Most likely this injury occurred due to a combination of increased activity and also compensation from the previous right foot fusion altering her gait. - Home exercises for the gastroc emphasis on lateral strengthening - She can continue ibuprofen 600 mg as needed as she was given from urgent care -Patient fitted with compression sleeve and encouraged to wear while active -Follow-up in 6 weeks     Nuala Alpha, DO PGY-4, Sports Medicine Fellow [place]

## 2020-03-14 ENCOUNTER — Ambulatory Visit: Payer: BC Managed Care – PPO | Admitting: Family Medicine

## 2020-06-22 ENCOUNTER — Ambulatory Visit: Admit: 2020-06-22 | Discharge status: Absent without leave | Payer: BC Managed Care – PPO

## 2021-06-24 ENCOUNTER — Other Ambulatory Visit: Payer: Self-pay

## 2021-06-24 ENCOUNTER — Ambulatory Visit: Admit: 2021-06-24 | Payer: BC Managed Care – PPO

## 2021-06-24 ENCOUNTER — Ambulatory Visit
Admission: EM | Admit: 2021-06-24 | Discharge: 2021-06-24 | Disposition: A | Discharge status: Home / Self Care | Payer: BC Managed Care – PPO | Attending: Emergency Medicine | Admitting: Emergency Medicine

## 2021-06-24 DIAGNOSIS — B349 Viral infection, unspecified: Secondary | ICD-10-CM | POA: Diagnosis not present

## 2021-06-24 DIAGNOSIS — R051 Acute cough: Secondary | ICD-10-CM

## 2021-06-24 MED ORDER — FLUTICASONE PROPIONATE 50 MCG/ACT NA SUSP: 2.0000 | Freq: Every day | NASAL | 2 refills | Status: DC

## 2021-06-24 MED ORDER — PREDNISONE 10 MG (21) PO TBPK: ORAL_TABLET | Freq: Every day | ORAL | 0 refills | Status: DC

## 2021-06-24 NOTE — ED Provider Notes (Signed)
[facility]    CSN: [id number] Arrival date & time: 06/24/21  0945      History   Chief Complaint Chief Complaint  Patient presents with   Cough   Sinus Pressure    HPI Lindsey Warren is a 61 y.o. female.   For 2 days patient has been having cough and nasal pressure.  Patient denies any fevers no shortness of breath.  She has been taking Coricidin over-the-counter that has helped slightly.  Patient states that she just wanted to make sure she was not contagious before she went back to work tomorrow.   Past Medical History:  Diagnosis Date   Diabetes mellitus without complication ([place])    Hypertension     Patient Active Problem List   Diagnosis Date Noted   Right knee pain 02/01/2020    Past Surgical History:  Procedure Laterality Date   COLONOSCOPY WITH PROPOFOL N/A 08/22/2019   Procedure: COLONOSCOPY WITH PROPOFOL;  Surgeon: Jonathon Bellows, MD;  Location: [facility] ENDOSCOPY;  Service: Gastroenterology;  Laterality: N/A;   FRACTURE SURGERY      OB History   No obstetric history on file.      Home Medications    Prior to Admission medications   Medication Sig Start Date End Date Taking? Authorizing Provider  amLODipine (NORVASC) 10 MG tablet take 1 tablet by mouth once daily 03/13/14  Yes [provider]  fluticasone (FLONASE) 50 MCG/ACT nasal spray Place 2 sprays into both nostrils daily. 06/24/21  Yes Marney Setting, NP  metFORMIN (GLUCOPHAGE-XR) 500 MG 24 hr tablet take 1 tablet by mouth once daily every morning BEFORE BREAKFAST 09/08/14  Yes [provider]  predniSONE (STERAPRED UNI-PAK 21 TAB) 10 MG (21) TBPK tablet Take by mouth daily. Take 6 tabs by mouth daily  for 2 days, then 5 tabs for 2 days, then 4 tabs for 2 days, then 3 tabs for 2 days, 2 tabs for 2 days, then 1 tab by mouth daily for 2 days 06/24/21  Yes Marney Setting, NP  tiZANidine (ZANAFLEX) 4 MG tablet Take 1 tablet (4 mg total) by mouth every 8 (eight) hours as  needed for muscle spasms. 01/29/20  Yes Melynda Ripple, MD  ibuprofen (ADVIL) 600 MG tablet Take 1 tablet (600 mg total) by mouth every 6 (six) hours as needed. 01/29/20   Melynda Ripple, MD  lisinopril-hydrochlorothiazide (PRINZIDE,ZESTORETIC) 20-25 MG per tablet Take by mouth. 09/14/14   [provider]  meloxicam (MOBIC) 15 MG tablet Take 15 mg by mouth daily. 01/30/20   [provider]  gabapentin (NEURONTIN) 300 MG capsule take 1 capsule by mouth at bedtime 06/10/14 01/29/20  [provider]    Family History Family History  Problem Relation Age of Onset   Hypertension Mother    Diabetes Mother    Diabetes Father    Hypertension Father     Social History Social History   Tobacco Use   Smoking status: Never   Smokeless tobacco: Never  Vaping Use   Vaping Use: Never used  Substance Use Topics   Alcohol use: Yes   Drug use: No     Allergies   Patient has no known allergies.   Review of Systems Review of Systems  Constitutional:  Negative for appetite change, chills, fatigue and fever.  HENT:  Positive for congestion, postnasal drip, rhinorrhea, sinus pressure, sinus pain and sneezing. Negative for sore throat.   Eyes: Negative.   Respiratory:  Positive for cough. Negative  for shortness of breath.   Cardiovascular: Negative.   Gastrointestinal: Negative.   Genitourinary: Negative.   Musculoskeletal: Negative.   Neurological: Negative.  Negative for dizziness and headaches.    Physical Exam Triage Vital Signs ED Triage Vitals  Enc Vitals Group     BP 06/24/21 1041 136/86     Pulse Rate 06/24/21 1041 88     Resp 06/24/21 1041 18     Temp 06/24/21 1041 98.6 F (37 C)     Temp Source 06/24/21 1041 Oral     SpO2 06/24/21 1041 99 %     Weight 06/24/21 1040 210 lb (95.3 kg)     Height 06/24/21 1040 5\' 6"  (1.676 m)     Head Circumference --      Peak Flow --      Pain Score 06/24/21 1040 0     Pain Loc --      Pain Edu? --      Excl.  in [place]? --    No data found.  Updated Vital Signs BP 136/86 (BP Location: Left Arm)    Pulse 88    Temp 98.6 F (37 C) (Oral)    Resp 18    Ht 5\' 6"  (1.676 m)    Wt 210 lb (95.3 kg)    LMP  (LMP Unknown)    SpO2 99%    BMI 33.89 kg/m   Visual Acuity Right Eye Distance:   Left Eye Distance:   Bilateral Distance:    Right Eye Near:   Left Eye Near:    Bilateral Near:     Physical Exam Constitutional:      Appearance: Normal appearance.  HENT:     Right Ear: Tympanic membrane normal.     Left Ear: Tympanic membrane normal.     Nose: Congestion and rhinorrhea present.     Mouth/Throat:     Mouth: Mucous membranes are moist.     Pharynx: No posterior oropharyngeal erythema.  Eyes:     Pupils: Pupils are equal, round, and reactive to light.  Cardiovascular:     Rate and Rhythm: Normal rate.     Pulses: Normal pulses.     Heart sounds: Normal heart sounds.  Pulmonary:     Effort: Pulmonary effort is normal.     Breath sounds: Normal breath sounds.  Abdominal:     General: Abdomen is flat.  Musculoskeletal:        General: Normal range of motion.     Cervical back: Normal range of motion.  Skin:    General: Skin is warm.     Capillary Refill: Capillary refill takes less than 2 seconds.  Neurological:     General: No focal deficit present.     Mental Status: She is alert.     UC Treatments / Results  Labs (all labs ordered are listed, but only abnormal results are displayed) Labs Reviewed - No data to display  EKG   Radiology No results found.  Procedures Procedures (including critical care time)  Medications Ordered in UC Medications - No data to display  Initial Impression / Assessment and Plan / UC Course  I have reviewed the triage vital signs and the nursing notes.  Pertinent labs & imaging results that were available during my care of the patient were reviewed by me and considered in my medical decision making (see chart for details).     Illness  it is more viral in nature Continue to take be over-the-counter  Coricidin Tylenol or Motrin as needed You can use a humidifier at night to help with the cough Follow-up with your primary care physician in the next 5 to 7 days Final Clinical Impressions(s) / UC Diagnoses   Final diagnoses:  Acute cough  Viral illness   Discharge Instructions   None    ED Prescriptions     Medication Sig Dispense Auth. Provider   predniSONE (STERAPRED UNI-PAK 21 TAB) 10 MG (21) TBPK tablet Take by mouth daily. Take 6 tabs by mouth daily  for 2 days, then 5 tabs for 2 days, then 4 tabs for 2 days, then 3 tabs for 2 days, 2 tabs for 2 days, then 1 tab by mouth daily for 2 days 42 tablet Morley Kos L, NP   fluticasone (FLONASE) 50 MCG/ACT nasal spray Place 2 sprays into both nostrils daily. 16 g Marney Setting, NP      PDMP not reviewed this encounter.   Marney Setting, NP 06/24/21 1115

## 2021-06-24 NOTE — ED Triage Notes (Signed)
Pt c/o sinus pressure, nasal drainage, cough x2days

## 2021-07-04 ENCOUNTER — Other Ambulatory Visit: Payer: Self-pay

## 2021-07-04 ENCOUNTER — Ambulatory Visit
Admission: EM | Admit: 2021-07-04 | Discharge: 2021-07-04 | Disposition: A | Discharge status: Home / Self Care | Payer: BC Managed Care – PPO

## 2021-07-04 DIAGNOSIS — H9202 Otalgia, left ear: Secondary | ICD-10-CM

## 2021-07-04 DIAGNOSIS — J209 Acute bronchitis, unspecified: Secondary | ICD-10-CM | POA: Diagnosis not present

## 2021-07-04 DIAGNOSIS — R051 Acute cough: Secondary | ICD-10-CM | POA: Diagnosis not present

## 2021-07-04 MED ORDER — DOXYCYCLINE HYCLATE 100 MG PO CAPS: 100.0000 mg | ORAL_CAPSULE | Freq: Two times a day (BID) | ORAL | 0 refills | Status: AC

## 2021-07-04 MED ORDER — HYDROCOD POLST-CPM POLST ER 10-8 MG/5ML PO SUER: 5.0000 mL | Freq: Every evening | ORAL | 0 refills | Status: DC | PRN

## 2021-07-04 NOTE — Discharge Instructions (Addendum)
-  You have bronchitis.  As we discussed the vast majority of times this is due to a virus and symptoms can last for a few weeks. - It is very important that you increase rest and fluids.  Discontinue the cough medicine you are prescribed and take Mucinex during the day and the Tussionex at bedtime. -I have sent antibiotics since you have been sick for couple of weeks but again, this is likely a virus and you will probably be sick for another week or 2. - If you develop a fever, increased chest discomfort or shortness of breath you should be seen again.

## 2021-07-04 NOTE — ED Provider Notes (Signed)
[facility]    CSN: [id number] Arrival date & time: 07/04/21  0810      History   Chief Complaint Chief Complaint  Patient presents with   Cough   Otalgia    HPI Lindsey Warren is a 61 y.o. female presenting for 2-week history of productive cough and congestion.  Patient says she also has left ear pain and cannot hear out of it.  She says it feels like she is "going through a tunnel."  Patient reports a lot of postnasal drainage and sinus pressure as well.  She says her cough is worse at night and it is keeping her up.  Patient was seen over a week ago in our department and had negative COVID and flu testing.  She was prescribed Promethazine DM by her PCP but says it has not helped her.  Patient also tried prednisone at onset of illness.  She says she feels like her symptoms are getting worse and not better.  No sick contacts.  Reports occasional shortness of breath but denies any chest pain, nausea/vomiting or diarrhea.  History of hypertension and diabetes.  No other complaints.  HPI  Past Medical History:  Diagnosis Date   Diabetes mellitus without complication ([place])    Hypertension     Patient Active Problem List   Diagnosis Date Noted   Right knee pain 02/01/2020    Past Surgical History:  Procedure Laterality Date   COLONOSCOPY WITH PROPOFOL N/A 08/22/2019   Procedure: COLONOSCOPY WITH PROPOFOL;  Surgeon: Jonathon Bellows, MD;  Location: [facility] ENDOSCOPY;  Service: Gastroenterology;  Laterality: N/A;   FRACTURE SURGERY      OB History   No obstetric history on file.      Home Medications    Prior to Admission medications   Medication Sig Start Date End Date Taking? Authorizing Provider  amLODipine (NORVASC) 10 MG tablet take 1 tablet by mouth once daily 03/13/14  Yes [provider]  chlorpheniramine-HYDROcodone (TUSSIONEX PENNKINETIC ER) 10-8 MG/5ML SUER Take 5 mLs by mouth at bedtime as needed for cough. 07/04/21  Yes Laurene Footman B, PA-C   doxycycline (VIBRAMYCIN) 100 MG capsule Take 1 capsule (100 mg total) by mouth 2 (two) times daily for 7 days. 07/04/21 07/11/21 Yes Laurene Footman B, PA-C  fluticasone (FLONASE) 50 MCG/ACT nasal spray Place 2 sprays into both nostrils daily. 06/24/21  Yes Marney Setting, NP  lisinopril-hydrochlorothiazide (PRINZIDE,ZESTORETIC) 20-25 MG per tablet Take by mouth. 09/14/14  Yes [provider]  metFORMIN (GLUCOPHAGE-XR) 500 MG 24 hr tablet take 1 tablet by mouth once daily every morning BEFORE BREAKFAST 09/08/14  Yes [provider]  tiZANidine (ZANAFLEX) 4 MG tablet Take 1 tablet (4 mg total) by mouth every 8 (eight) hours as needed for muscle spasms. 01/29/20  Yes Melynda Ripple, MD  ibuprofen (ADVIL) 600 MG tablet Take 1 tablet (600 mg total) by mouth every 6 (six) hours as needed. 01/29/20   Melynda Ripple, MD  meloxicam (MOBIC) 15 MG tablet Take 15 mg by mouth daily. 01/30/20   [provider]  gabapentin (NEURONTIN) 300 MG capsule take 1 capsule by mouth at bedtime 06/10/14 01/29/20  [provider]    Family History Family History  Problem Relation Age of Onset   Hypertension Mother    Diabetes Mother    Diabetes Father    Hypertension Father     Social History Social History   Tobacco Use   Smoking status: Never   Smokeless tobacco: Never  Vaping Use   Vaping Use: Never used  Substance Use Topics   Alcohol use: Not Currently   Drug use: No     Allergies   Patient has no known allergies.   Review of Systems Review of Systems  Constitutional:  Positive for fatigue. Negative for chills, diaphoresis and fever.  HENT:  Positive for congestion, ear pain, hearing loss, postnasal drip, rhinorrhea and sinus pressure. Negative for sinus pain and sore throat.   Respiratory:  Positive for cough and shortness of breath.   Cardiovascular:  Negative for chest pain.  Gastrointestinal:  Negative for abdominal pain, nausea and vomiting.   Musculoskeletal:  Negative for arthralgias and myalgias.  Skin:  Negative for rash.  Neurological:  Negative for weakness and headaches.  Hematological:  Negative for adenopathy.  Psychiatric/Behavioral:  Positive for sleep disturbance.     Physical Exam Triage Vital Signs ED Triage Vitals  Enc Vitals Group     BP      Pulse      Resp      Temp      Temp src      SpO2      Weight      Height      Head Circumference      Peak Flow      Pain Score      Pain Loc      Pain Edu?      Excl. in [place]?    No data found.  Updated Vital Signs BP (!) 141/90 (BP Location: Left Arm)    Pulse 97    Temp 98.7 F (37.1 C) (Oral)    Resp 18    Ht 5\' 6"  (1.676 m)    Wt 210 lb (95.3 kg)    LMP  (LMP Unknown)    SpO2 97%    BMI 33.89 kg/m      Physical Exam Vitals and nursing note reviewed.  Constitutional:      General: She is not in acute distress.    Appearance: Normal appearance. She is ill-appearing. She is not toxic-appearing.  HENT:     Head: Normocephalic and atraumatic.     Right Ear: A middle ear effusion is present.     Left Ear: A middle ear effusion is present.     Nose: Congestion present.     Mouth/Throat:     Mouth: Mucous membranes are moist.     Pharynx: Oropharynx is clear.  Eyes:     General: No scleral icterus.       Right eye: No discharge.        Left eye: No discharge.     Conjunctiva/sclera: Conjunctivae normal.  Cardiovascular:     Rate and Rhythm: Normal rate and regular rhythm.     Heart sounds: Normal heart sounds.  Pulmonary:     Effort: Pulmonary effort is normal. No respiratory distress.     Breath sounds: Rhonchi (diffuse rhonchi throughout all lung fields) present.  Musculoskeletal:     Cervical back: Neck supple.  Skin:    General: Skin is dry.  Neurological:     General: No focal deficit present.     Mental Status: She is alert. Mental status is at baseline.     Motor: No weakness.     Gait: Gait normal.  Psychiatric:        Mood and  Affect: Mood normal.        Behavior: Behavior normal.  Thought Content: Thought content normal.     UC Treatments / Results  Labs (all labs ordered are listed, but only abnormal results are displayed) Labs Reviewed - No data to display  EKG   Radiology No results found.  Procedures Procedures (including critical care time)  Medications Ordered in UC Medications - No data to display  Initial Impression / Assessment and Plan / UC Course  I have reviewed the triage vital signs and the nursing notes.  Pertinent labs & imaging results that were available during my care of the patient were reviewed by me and considered in my medical decision making (see chart for details).  61 year old female presenting for 2-week history of productive cough and congestion.  Reports difficulty sleeping due to cough.  Occasional shortness of breath.  No associated fever.  Negative COVID and flu testing at onset.  Believes symptoms are worsening.  Patient is afebrile.  She is mildly ill-appearing but nontoxic.  On exam she does have nasal congestion and effusion of bilateral TMs without evidence of infection.  Diffuse rhonchi throughout all lung fields.  No respiratory distress.  Advised patient symptoms consistent with bronchitis which is likely viral.  She wants to try an antibiotic.  I discussed with her that it may not be helpful if this is a viral infection which is most likely the case.  She would like to try it anyway.  Sent doxycycline to pharmacy.  Also advised her to discontinue Promethazine DM and start Mucinex during the day and drink plenty fluids.  Tussionex at bedtime.  I did review controlled substance database and find her to be low risk for abuse.  Reviewed return precautions.   Final Clinical Impressions(s) / UC Diagnoses   Final diagnoses:  Acute bronchitis, unspecified organism  Acute cough  Left ear pain     Discharge Instructions      -You have bronchitis.  As we  discussed the vast majority of times this is due to a virus and symptoms can last for a few weeks. - It is very important that you increase rest and fluids.  Discontinue the cough medicine you are prescribed and take Mucinex during the day and the Tussionex at bedtime. -I have sent antibiotics since you have been sick for couple of weeks but again, this is likely a virus and you will probably be sick for another week or 2. - If you develop a fever, increased chest discomfort or shortness of breath you should be seen again.     ED Prescriptions     Medication Sig Dispense Auth. Provider   doxycycline (VIBRAMYCIN) 100 MG capsule Take 1 capsule (100 mg total) by mouth 2 (two) times daily for 7 days. 14 capsule Laurene Footman B, PA-C   chlorpheniramine-HYDROcodone (TUSSIONEX PENNKINETIC ER) 10-8 MG/5ML SUER Take 5 mLs by mouth at bedtime as needed for cough. 50 mL Danton Clap, PA-C      PDMP not reviewed this encounter.   Danton Clap, PA-C 07/04/21 [id number]

## 2021-07-04 NOTE — ED Triage Notes (Signed)
Pt c/o cough, loss of hearing in her left ear. Pt states that it sounds like shes "going through a tunnel". Pt is having drainage.   Pt has had continued symptoms since her last visit on 06/24/21. Pt states that the prednisone kept her awake and that she is having trouble sleeping. She was prescribed cough syrup Promethazine- Dextromethorphan by her PCP. Pt had a covid/flu/RSV test and they came back negative 06/26/20.

## 2022-07-20 ENCOUNTER — Ambulatory Visit
Admission: RE | Admit: 2022-07-20 | Discharge: 2022-07-20 | Disposition: A | Discharge status: Home / Self Care | Payer: BC Managed Care – PPO | Source: Ambulatory Visit | Attending: Family Medicine | Admitting: Family Medicine

## 2022-07-20 VITALS — BP 131/74 | HR 82 | Temp 98.2°F | Resp 14 | Ht 66.0 in | Wt 210.0 lb

## 2022-07-20 DIAGNOSIS — U071 COVID-19: Secondary | ICD-10-CM | POA: Diagnosis present

## 2022-07-20 LAB — RESP PANEL BY RT-PCR (RSV, FLU A&B, COVID)  RVPGX2
Influenza A by PCR: NEGATIVE
Influenza B by PCR: NEGATIVE
Resp Syncytial Virus by PCR: NEGATIVE
SARS Coronavirus 2 by RT PCR: POSITIVE — AB

## 2022-07-20 MED ORDER — IPRATROPIUM BROMIDE 0.06 % NA SOLN: 2.0000 | Freq: Four times a day (QID) | NASAL | 12 refills | Status: DC

## 2022-07-20 MED ORDER — PROMETHAZINE-DM 6.25-15 MG/5ML PO SYRP: 5.0000 mL | ORAL_SOLUTION | Freq: Four times a day (QID) | ORAL | 0 refills | Status: DC | PRN

## 2022-07-20 MED ORDER — PAXLOVID (300/100) 20 X 150 MG & 10 X 100MG PO TBPK: 3.0000 | ORAL_TABLET | Freq: Two times a day (BID) | ORAL | 0 refills | Status: AC

## 2022-07-20 NOTE — Discharge Instructions (Addendum)
Cut your blood pressure medication (amlodipine only) in half.   Your test for COVID-19 was positive, meaning that you were infected with the novel coronavirus and could give the germ to others.  Please continue isolation at home for at least 5 days since the start of your symptoms. Once you complete your 5 day quarantine, you may return to normal activities as long as you've not had a fever for over 24 hours(without taking fever reducing medicine) and your symptoms are improving. Be sure to wear a mask until Day 11.   If your were prescribed medication. Stop by the pharmacy to pick it up.   Please continue good preventive care measures, including:  frequent hand-washing, avoid touching your face, cover coughs/sneezes, stay out of crowds and keep a 6 foot distance from others.  Go to the nearest hospital emergency room if fever/cough/breathlessness are severe or illness seems like a threat to life.  You can take Tylenol and/or Ibuprofen as needed for fever reduction and pain relief.    For cough: honey 1/2 to 1 teaspoon (you can dilute the honey in water or another fluid).  You can also use guaifenesin and dextromethorphan for cough. You can use a humidifier for chest congestion and cough.  If you don't have a humidifier, you can sit in the bathroom with the hot shower running.      For sore throat: try warm salt water gargles, Mucinex sore throat cough drops or cepacol lozenges, throat spray, warm tea or water with lemon/honey, popsicles or ice, or OTC cold relief medicine for throat discomfort. You can also purchase chloraseptic spray at the pharmacy or dollar store.   For congestion: take a daily anti-histamine like Zyrtec, Claritin, and a oral decongestant, such as pseudoephedrine.  You can also use Flonase 1-2 sprays in each nostril daily. Afrin is also a good option, if you do not have high blood pressure.    It is important to stay hydrated: drink plenty of fluids (water,  gatorade/powerade/pedialyte, juices, or teas) to keep your throat moisturized and help further relieve irritation/discomfort.    Return or go to the Emergency Department if symptoms worsen or do not improve in the next few days

## 2022-07-20 NOTE — ED Triage Notes (Signed)
Patient c/o nasal congestion, cough, chest congestion, and bodyaches and headaches that started on Friday.  Patient denies fevers.

## 2022-07-20 NOTE — ED Provider Notes (Signed)
[facility]    CSN: [id number] Arrival date & time: 07/20/22  1601      History   Chief Complaint Chief Complaint  Patient presents with   Cough   Generalized Body Aches    HPI Lindsey Warren is a 62 y.o. female.   HPI   Lindsey Warren presents for cough, body aches, scratchy throat, fatigue and productive cough since Friday. She laid in bed all day yesterday. She has trouble hearing out of her left ear as it feels full. Pt's husband believes she may have picked something up at [employer] where Lindsey Warren works.    Fever : no  Chills: yes Sore throat: no Cough: yes Sputum: yes Nasal congestion : yes Rhinorrhea: yes Myalgias: yes Appetite: decrease  Hydration: normal  Abdominal pain: no Nausea: no Vomiting: no Diarrhea: No Chest pain: no  Shortness of breath: no Rash: No Sleep disturbance: yes due to coughing  Headache: yes     Past Medical History:  Diagnosis Date   Diabetes mellitus without complication ([place])    Hypertension     Patient Active Problem List   Diagnosis Date Noted   Right knee pain 02/01/2020    Past Surgical History:  Procedure Laterality Date   COLONOSCOPY WITH PROPOFOL N/A 08/22/2019   Procedure: COLONOSCOPY WITH PROPOFOL;  Surgeon: Jonathon Bellows, MD;  Location: [facility] ENDOSCOPY;  Service: Gastroenterology;  Laterality: N/A;   FRACTURE SURGERY      OB History   No obstetric history on file.      Home Medications    Prior to Admission medications   Medication Sig Start Date End Date Taking? Authorizing Provider  amLODipine (NORVASC) 10 MG tablet take 1 tablet by mouth once daily 03/13/14  Yes [provider]  ipratropium (ATROVENT) 0.06 % nasal spray Place 2 sprays into both nostrils 4 (four) times daily. 07/20/22  Yes Sadi Arave, Ronnette Juniper, DO  lisinopril-hydrochlorothiazide (PRINZIDE,ZESTORETIC) 20-25 MG per tablet Take by mouth. 09/14/14  Yes [provider]  metFORMIN (GLUCOPHAGE-XR) 500 MG 24 hr tablet take 1  tablet by mouth once daily every morning BEFORE BREAKFAST 09/08/14  Yes [provider]  nirmatrelvir & ritonavir (PAXLOVID, 300/100,) 20 x 150 MG & 10 x '100MG'$  TBPK Take 3 tablets by mouth 2 (two) times daily for 5 days. 07/20/22 07/25/22 Yes Jaylena Holloway, DO  promethazine-dextromethorphan (PROMETHAZINE-DM) 6.25-15 MG/5ML syrup Take 5 mLs by mouth 4 (four) times daily as needed. 07/20/22  Yes Jalecia Leon, DO  fluticasone (FLONASE) 50 MCG/ACT nasal spray Place 2 sprays into both nostrils daily. 06/24/21   Marney Setting, NP  ibuprofen (ADVIL) 600 MG tablet Take 1 tablet (600 mg total) by mouth every 6 (six) hours as needed. 01/29/20   Melynda Ripple, MD  meloxicam (MOBIC) 15 MG tablet Take 15 mg by mouth daily. 01/30/20   [provider]  tiZANidine (ZANAFLEX) 4 MG tablet Take 1 tablet (4 mg total) by mouth every 8 (eight) hours as needed for muscle spasms. 01/29/20   Melynda Ripple, MD  gabapentin (NEURONTIN) 300 MG capsule take 1 capsule by mouth at bedtime 06/10/14 01/29/20  [provider]    Family History Family History  Problem Relation Age of Onset   Hypertension Mother    Diabetes Mother    Diabetes Father    Hypertension Father     Social History Social History   Tobacco Use   Smoking status: Never   Smokeless tobacco: Never  Vaping Use   Vaping Use: Never used  Substance Use Topics   Alcohol use: Not Currently   Drug use: No     Allergies   Patient has no known allergies.   Review of Systems Review of Systems: negative unless otherwise stated in HPI.      Physical Exam Triage Vital Signs ED Triage Vitals  Enc Vitals Group     BP 07/20/22 0941 131/74     Pulse Rate 07/20/22 0941 82     Resp 07/20/22 0941 14     Temp 07/20/22 0941 98.2 F (36.8 C)     Temp Source 07/20/22 0941 Oral     SpO2 07/20/22 0941 98 %     Weight 07/20/22 0938 210 lb (95.3 kg)     Height 07/20/22 0938 '5\' 6"'$  (1.676 m)     Head Circumference --       Peak Flow --      Pain Score 07/20/22 0938 3     Pain Loc --      Pain Edu? --      Excl. in [place]? --    No data found.  Updated Vital Signs BP 131/74 (BP Location: Right Arm)   Pulse 82   Temp 98.2 F (36.8 C) (Oral)   Resp 14   Ht '5\' 6"'$  (1.676 m)   Wt 95.3 kg   LMP  (LMP Unknown)   SpO2 98%   BMI 33.89 kg/m   Visual Acuity Right Eye Distance:   Left Eye Distance:   Bilateral Distance:    Right Eye Near:   Left Eye Near:    Bilateral Near:     Physical Exam GEN:     alert, non-toxic appearing older female in no distress    HENT:  mucus membranes moist, oropharyngeal without lesions or exudate, no tonsillar hypertrophy,  mild oropharyngeal erythema , clear nasal discharge, bilateral TM normal EYES:   pupils equal and reactive, no scleral injection or discharge NECK:  normal ROM RESP:  no increased work of breathing, clear to auscultation bilaterally CVS:   regular rate and rhythm Skin:   warm and dry    UC Treatments / Results  Labs (all labs ordered are listed, but only abnormal results are displayed) Labs Reviewed  RESP PANEL BY RT-PCR (RSV, FLU A&B, COVID)  [id number] - Abnormal; Notable for the following components:      Result Value   SARS Coronavirus 2 by RT PCR POSITIVE (*)    All other components within normal limits    EKG   Radiology No results found.  Procedures Procedures (including critical care time)  Medications Ordered in UC Medications - No data to display  Initial Impression / Assessment and Plan / UC Course  I have reviewed the triage vital signs and the nursing notes.  Pertinent labs & imaging results that were available during my care of the patient were reviewed by me and considered in my medical decision making (see chart for details).       Pt is a 62 y.o. female who presents for 1-2 days of respiratory symptoms. [name] is afebrile here without recent antipyretics. Satting well on room air. Overall pt is non-toxic appearing,  well hydrated, without respiratory distress. Pulmonary exam is unremarkable.  COVID and influenza testing obtained and is COVID positive.  Discussed Paxlovid and she is interested in this.  Discussed symptomatic treatment as below.  Rx's sent to the pharmacy. Typical duration of symptoms discussed. Work note provided. Advised pt's husband to take a COVID test.  Return and ED precautions given and voiced understanding. Discussed MDM, treatment plan and plan for follow-up with patient who agrees with plan.     Final Clinical Impressions(s) / UC Diagnoses   Final diagnoses:  [id number]     Discharge Instructions      Cut your blood pressure medication (amlodipine only) in half.   Your test for COVID-19 was positive, meaning that you were infected with the novel coronavirus and could give the germ to others.  Please continue isolation at home for at least 5 days since the start of your symptoms. Once you complete your 5 day quarantine, you may return to normal activities as long as you've not had a fever for over 24 hours(without taking fever reducing medicine) and your symptoms are improving. Be sure to wear a mask until Day 11.   If your were prescribed medication. Stop by the pharmacy to pick it up.   Please continue good preventive care measures, including:  frequent hand-washing, avoid touching your face, cover coughs/sneezes, stay out of crowds and keep a 6 foot distance from others.  Go to the nearest hospital emergency room if fever/cough/breathlessness are severe or illness seems like a threat to life.  You can take Tylenol and/or Ibuprofen as needed for fever reduction and pain relief.    For cough: honey 1/2 to 1 teaspoon (you can dilute the honey in water or another fluid).  You can also use guaifenesin and dextromethorphan for cough. You can use a humidifier for chest congestion and cough.  If you don't have a humidifier, you can sit in the bathroom with the hot shower running.       For sore throat: try warm salt water gargles, Mucinex sore throat cough drops or cepacol lozenges, throat spray, warm tea or water with lemon/honey, popsicles or ice, or OTC cold relief medicine for throat discomfort. You can also purchase chloraseptic spray at the pharmacy or dollar store.   For congestion: take a daily anti-histamine like Zyrtec, Claritin, and a oral decongestant, such as pseudoephedrine.  You can also use Flonase 1-2 sprays in each nostril daily. Afrin is also a good option, if you do not have high blood pressure.    It is important to stay hydrated: drink plenty of fluids (water, gatorade/powerade/pedialyte, juices, or teas) to keep your throat moisturized and help further relieve irritation/discomfort.    Return or go to the Emergency Department if symptoms worsen or do not improve in the next few days      ED Prescriptions     Medication Sig Dispense Auth. Provider   nirmatrelvir & ritonavir (PAXLOVID, 300/100,) 20 x 150 MG & 10 x '100MG'$  TBPK Take 3 tablets by mouth 2 (two) times daily for 5 days. 30 tablet Philis Doke, DO   promethazine-dextromethorphan (PROMETHAZINE-DM) 6.25-15 MG/5ML syrup Take 5 mLs by mouth 4 (four) times daily as needed. 118 mL Raha Tennison, DO   ipratropium (ATROVENT) 0.06 % nasal spray Place 2 sprays into both nostrils 4 (four) times daily. 15 mL Lyndee Hensen, DO      PDMP not reviewed this encounter.   Lyndee Hensen, DO 07/20/22 1238

## 2023-04-30 ENCOUNTER — Ambulatory Visit
Admission: RE | Admit: 2023-04-30 | Discharge: 2023-04-30 | Disposition: A | Discharge status: Home / Self Care | Payer: BC Managed Care – PPO | Source: Ambulatory Visit

## 2023-04-30 VITALS — BP 131/86 | HR 74 | Temp 98.6°F | Ht 66.0 in | Wt 205.0 lb

## 2023-04-30 DIAGNOSIS — L03213 Periorbital cellulitis: Secondary | ICD-10-CM

## 2023-04-30 MED ORDER — AMOXICILLIN-POT CLAVULANATE 875-125 MG PO TABS: 1.0000 | ORAL_TABLET | Freq: Two times a day (BID) | ORAL | 0 refills | Status: AC

## 2023-04-30 NOTE — ED Triage Notes (Signed)
Pt c/o left eye swelling and soreness x3days  Pt states that the majority of pain is along the top eye lid  Pt denies any new foods or facial washes.   Pt denies any blurriness and states that she can see well, her eye is itchy and her eye lid overlaps and she can see it from the swelling.

## 2023-04-30 NOTE — ED Provider Notes (Signed)
[facility]    CSN: [id number] Arrival date & time: 04/30/23  1205      History   Chief Complaint Chief Complaint  Patient presents with   Eye Problem    HPI Lindsey Warren is a 62 y.o. female.   HPI  62 year old female with past medical history significant for hypertension and diabetes presents for evaluation of left upper eyelid swelling and soreness for the past 3 days.  She reports that the eyelid is itchy and she will wake up in the morning with matted eyelashes.  She denies any changes in vision or trauma.  No known sick contacts.  Past Medical History:  Diagnosis Date   Diabetes mellitus without complication (HCC)    Hypertension     Patient Active Problem List   Diagnosis Date Noted   Right knee pain 02/01/2020    Past Surgical History:  Procedure Laterality Date   COLONOSCOPY WITH PROPOFOL N/A 08/22/2019   Procedure: COLONOSCOPY WITH PROPOFOL;  Surgeon: Wyline Mood, MD;  Location: [facility] ENDOSCOPY;  Service: Gastroenterology;  Laterality: N/A;   FRACTURE SURGERY      OB History   No obstetric history on file.      Home Medications    Prior to Admission medications   Medication Sig Start Date End Date Taking? Authorizing Provider  amLODipine (NORVASC) 10 MG tablet take 1 tablet by mouth once daily 03/13/14  Yes [provider]  amoxicillin-clavulanate (AUGMENTIN) 875-125 MG tablet Take 1 tablet by mouth every 12 (twelve) hours for 10 days. 04/30/23 05/10/23 Yes Becky Augusta, NP  ipratropium (ATROVENT) 0.06 % nasal spray Place 2 sprays into both nostrils 4 (four) times daily. 07/20/22  Yes Brimage, Seward Meth, DO  lisinopril-hydrochlorothiazide (PRINZIDE,ZESTORETIC) 20-25 MG per tablet Take by mouth. 09/14/14  Yes [provider]  MOUNJARO 2.5 MG/0.5ML Pen SMARTSIG:2.5 Milligram(s) SUB-Q Once a Week 01/23/23  Yes [provider]  tiZANidine (ZANAFLEX) 4 MG tablet Take 1 tablet (4 mg total) by mouth every 8 (eight) hours as  needed for muscle spasms. 01/29/20  Yes Domenick Gong, MD  fluticasone (FLONASE) 50 MCG/ACT nasal spray Place 2 sprays into both nostrils daily. 06/24/21   Coralyn Mark, NP  ibuprofen (ADVIL) 600 MG tablet Take 1 tablet (600 mg total) by mouth every 6 (six) hours as needed. 01/29/20   Domenick Gong, MD  meloxicam (MOBIC) 15 MG tablet Take 15 mg by mouth daily. 01/30/20   [provider]  metFORMIN (GLUCOPHAGE-XR) 500 MG 24 hr tablet take 1 tablet by mouth once daily every morning BEFORE BREAKFAST 09/08/14   [provider]  promethazine-dextromethorphan (PROMETHAZINE-DM) 6.25-15 MG/5ML syrup Take 5 mLs by mouth 4 (four) times daily as needed. 07/20/22   Katha Cabal, DO  gabapentin (NEURONTIN) 300 MG capsule take 1 capsule by mouth at bedtime 06/10/14 01/29/20  [provider]    Family History Family History  Problem Relation Age of Onset   Hypertension Mother    Diabetes Mother    Diabetes Father    Hypertension Father     Social History Social History   Tobacco Use   Smoking status: Never   Smokeless tobacco: Never  Vaping Use   Vaping status: Never Used  Substance Use Topics   Alcohol use: Not Currently   Drug use: No     Allergies   Patient has no known allergies.   Review of Systems Review of Systems  Eyes:  Positive for pain, discharge, redness and itching. Negative for visual  disturbance.     Physical Exam Triage Vital Signs ED Triage Vitals  Encounter Vitals Group     BP      Systolic BP Percentile      Diastolic BP Percentile      Pulse      Resp      Temp      Temp src      SpO2      Weight      Height      Head Circumference      Peak Flow      Pain Score      Pain Loc      Pain Education      Exclude from Growth Chart    No data found.  Updated Vital Signs BP 131/86 (BP Location: Left Arm)   Pulse 74   Temp 98.6 F (37 C) (Oral)   Ht 5\' 6"  (1.676 m)   Wt 205 lb (93 kg)   LMP  (LMP Unknown)   SpO2  97%   BMI 33.09 kg/m   Visual Acuity Right Eye Distance:   Left Eye Distance:   Bilateral Distance:    Right Eye Near:   Left Eye Near:    Bilateral Near:     Physical Exam Vitals and nursing note reviewed.  Constitutional:      Appearance: Normal appearance. She is not ill-appearing.  HENT:     Head: Normocephalic and atraumatic.  Eyes:     General: No scleral icterus.       Left eye: Discharge present.    Extraocular Movements: Extraocular movements intact.     Conjunctiva/sclera: Conjunctivae normal.     Pupils: Pupils are equal, round, and reactive to light.  Skin:    General: Skin is warm and dry.     Capillary Refill: Capillary refill takes less than 2 seconds.     Findings: Erythema present.  Neurological:     General: No focal deficit present.     Mental Status: She is alert and oriented to person, place, and time.      UC Treatments / Results  Labs (all labs ordered are listed, but only abnormal results are displayed) Labs Reviewed - No data to display  EKG   Radiology No results found.  Procedures Procedures (including critical care time)  Medications Ordered in UC Medications - No data to display  Initial Impression / Assessment and Plan / UC Course  I have reviewed the triage vital signs and the nursing notes.  Pertinent labs & imaging results that were available during my care of the patient were reviewed by me and considered in my medical decision making (see chart for details).   Patient is a pleasant, nontoxic-appearing 62 year old female presenting for evaluation of redness, swelling, drainage from the left upper eyelid x 3 days.  As you can see in image above, there is milky discharge in the inner and outer canthus as well as edema to the upper eyelid with mild erythema.  No induration or fluctuance but it is tender to palpation.  Bulbar and labral conjunctiva are unremarkable and patient's pupils equal round reactive with a normal red  light reflex.  EOM is intact.  Patient exam is consistent with preseptal cellulitis.  I will discharge her home on Augmentin 875 twice daily for 10 days.  We discussed using warm compresses to help facilitate drainage and using over-the-counter NSAIDs or Tylenol as needed for pain.  Return and ER precautions reviewed.  Final Clinical Impressions(s) / UC Diagnoses   Final diagnoses:  Preseptal cellulitis of left upper eyelid     Discharge Instructions      Take the Augmentin 875 mg twice daily with food for 10 days for treatment of your preseptal cellulitis.  You may apply warm compresses to your eye to help facilitate drainage.  You may do this for 20 minutes at a time 2-3 times a day.  Take over-the-counter Tylenol and/or ibuprofen according the package instructions as needed for any discomfort you may experience.  If you develop any increasing redness, swelling, pain, changes in vision, or fever I recommend that you use follow-up with your eye doctor or in the ER.     ED Prescriptions     Medication Sig Dispense Auth. Provider   amoxicillin-clavulanate (AUGMENTIN) 875-125 MG tablet Take 1 tablet by mouth every 12 (twelve) hours for 10 days. 20 tablet Becky Augusta, NP      PDMP not reviewed this encounter.   Becky Augusta, NP 04/30/23 1249

## 2023-04-30 NOTE — Discharge Instructions (Addendum)
Take the Augmentin 875 mg twice daily with food for 10 days for treatment of your preseptal cellulitis.  You may apply warm compresses to your eye to help facilitate drainage.  You may do this for 20 minutes at a time 2-3 times a day.  Take over-the-counter Tylenol and/or ibuprofen according the package instructions as needed for any discomfort you may experience.  If you develop any increasing redness, swelling, pain, changes in vision, or fever I recommend that you use follow-up with your eye doctor or in the ER.

## 2023-08-20 ENCOUNTER — Encounter: Payer: Self-pay | Admitting: Emergency Medicine

## 2023-08-20 ENCOUNTER — Ambulatory Visit
Admission: RE | Admit: 2023-08-20 | Discharge: 2023-08-20 | Disposition: A | Discharge status: Home / Self Care | Payer: Self-pay | Source: Ambulatory Visit | Attending: Emergency Medicine | Admitting: Emergency Medicine

## 2023-08-20 VITALS — BP 127/81 | HR 87 | Temp 98.2°F | Resp 18

## 2023-08-20 DIAGNOSIS — R0981 Nasal congestion: Secondary | ICD-10-CM

## 2023-08-20 DIAGNOSIS — Z20822 Contact with and (suspected) exposure to covid-19: Secondary | ICD-10-CM | POA: Diagnosis not present

## 2023-08-20 LAB — SARS CORONAVIRUS 2 BY RT PCR: SARS Coronavirus 2 by RT PCR: NEGATIVE

## 2023-08-20 MED ORDER — FLUTICASONE PROPIONATE 50 MCG/ACT NA SUSP: 2.0000 | Freq: Every day | NASAL | 0 refills | Status: AC

## 2023-08-20 NOTE — Discharge Instructions (Signed)
 We will contact you if and only if your COVID testing comes back positive.  If it comes back positive, you will need to mask around others at all times for 10 days after symptom onset.  You can either do Mucinex and continue the Sudafed every 12 hours, or you can Start Mucinex-D to keep the mucous thin and to decongest you.   You may take 600 mg of motrin with 1000 mg of tylenol up to 3-4 times a day as needed for pain. This is an effective combination for pain.  Most sinus infections are viral and do not need antibiotics unless you have a high fever above 102, have facial swelling, upper dental pain, have had this for 10 days, or you get better and then get sick again.  Follow-up with your primary care provider or return here if this happens. use a NeilMed sinus rinse with distilled water as often as you want to to reduce nasal congestion. Follow the directions on the box.   Go to www.goodrx.com to look up your medications. This will give you a list of where you can find your prescriptions at the most affordable prices. Or you can ask the pharmacist what the cash price is. This is frequently cheaper than going through insurance.

## 2023-08-20 NOTE — ED Triage Notes (Signed)
 Patient presents with c/o nasal congestion x 3 days. Patient states she is taking Sudafed 12hr for the symptoms. Denies any other symptoms at this time.

## 2023-08-20 NOTE — ED Provider Notes (Signed)
 HPI  SUBJECTIVE:  Lindsey Warren is a 63 y.o. female who presents with 3 days of nasal congestion, clear/yellow rhinorrhea, sinus pressure and postnasal drip.  He was, body aches, headaches, facial swelling, upper dental pain, sore throat, coughing, wheezing, shortness of breath, nausea, vomiting, diarrhea, abdominal pain.  No known COVID or flu exposure.  She got the COVID vaccines and this years flu vaccine.  No antibiotics in the past month.  No antipyretic in the past 6 hours.  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  She has been taking Sudafed every 12 hours Coricidin and doing saline spray.  The Sudafed and saline spray helped.  No getting factors.  She has a past medical history of well-controlled diabetes and hypertension.  PCP: [facility].   Past Medical History:  Diagnosis Date   Diabetes mellitus without complication (HCC)    Hypertension     Past Surgical History:  Procedure Laterality Date   COLONOSCOPY WITH PROPOFOL N/A 08/22/2019   Procedure: COLONOSCOPY WITH PROPOFOL;  Surgeon: Wyline Mood, MD;  Location: [facility] ENDOSCOPY;  Service: Gastroenterology;  Laterality: N/A;   FRACTURE SURGERY      Family History  Problem Relation Age of Onset   Hypertension Mother    Diabetes Mother    Diabetes Father    Hypertension Father     Social History   Tobacco Use   Smoking status: Never   Smokeless tobacco: Never  Vaping Use   Vaping status: Never Used  Substance Use Topics   Alcohol use: Not Currently   Drug use: No    No current facility-administered medications for this encounter.  Current Outpatient Medications:    fluticasone (FLONASE) 50 MCG/ACT nasal spray, Place 2 sprays into both nostrils daily., Disp: 16 g, Rfl: 0   amLODipine (NORVASC) 10 MG tablet, take 1 tablet by mouth once daily, Disp: , Rfl:    hydrOXYzine (ATARAX) 25 MG tablet, Take 25 mg by mouth daily., Disp: ,  Rfl:    ibuprofen (ADVIL) 600 MG tablet, Take 1 tablet (600 mg total) by mouth every 6 (six) hours as needed., Disp: 30 tablet, Rfl: 0   ipratropium (ATROVENT) 0.06 % nasal spray, Place 2 sprays into both nostrils 4 (four) times daily., Disp: 15 mL, Rfl: 12   lisinopril-hydrochlorothiazide (PRINZIDE,ZESTORETIC) 20-25 MG per tablet, Take by mouth., Disp: , Rfl:    losartan-hydrochlorothiazide (HYZAAR) 50-12.5 MG tablet, Take 1 tablet by mouth daily., Disp: , Rfl:    MOUNJARO 2.5 MG/0.5ML Pen, SMARTSIG:2.5 Milligram(s) SUB-Q Once a Week, Disp: , Rfl:   Allergies  Allergen Reactions   Lisinopril Other (See Comments)    Cough, lip tingling     ROS  As noted in HPI.   Physical Exam  BP 127/81 (BP Location: Left Arm)   Pulse 87   Temp 98.2 F (36.8 C) (Oral)   Resp 18   LMP  (LMP Unknown)   SpO2 99%   Constitutional: Well developed, well nourished, no acute distress Eyes: PERRL, EOMI, conjunctiva normal bilaterally HENT: Normocephalic, atraumatic,mucus membranes moist.  Erythematous, swollen turbinates.  Clear nasal congestion.  No maxillary, frontal sinus tenderness.  Normal oropharynx.  Uvula midline.  No postnasal drip. Neck: Positive cervical lymphadenopathy Respiratory: Normal inspiratory effort Cardiovascular: Normal rate GI: nondistended skin: No rash, skin intact Musculoskeletal: no deformities Neurologic: Alert & oriented x 3, CN III-XII grossly intact, no motor deficits, sensation grossly  intact Psychiatric: Speech and behavior appropriate   ED Course   Medications - No data to display  Orders Placed This Encounter  Procedures   SARS Coronavirus 2 by RT PCR (hospital order, performed in [facility] hospital lab) *cepheid single result test* Anterior Nasal Swab    Standing Status:   Standing    Number of Occurrences:   1   Results for orders placed or performed during the hospital encounter of 08/20/23 (from the past 24 hours)  SARS Coronavirus 2 by RT PCR  (hospital order, performed in [facility] hospital lab) *cepheid single result test* Anterior Nasal Swab     Status: None   Collection Time: 08/20/23 10:00 AM   Specimen: Anterior Nasal Swab  Result Value Ref Range   SARS Coronavirus 2 by RT PCR NEGATIVE NEGATIVE   No results found.  ED Clinical Impression  1. Nasal congestion   2. Encounter for laboratory testing for COVID-19 virus      ED Assessment/Plan     Patient presents with an upper respiratory infection.  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  In the meantime, continue Sudafed.  Mucinex, saline nasal occasion, Flonase.  No indications for antibiotics at this time, discussed this with patient.  Discussed signs and symptoms that should prompt return to the urgent care or her PCP for antibiotics.  Patient agrees with plan.  COVID-negative.  Plan as above.  Discussed labs, MDM, treatment plan, and plan for follow-up with patient . patient agrees with plan.   Meds ordered this encounter  Medications   fluticasone (FLONASE) 50 MCG/ACT nasal spray    Sig: Place 2 sprays into both nostrils daily.    Dispense:  16 g    Refill:  0      *This clinic note was created using Scientist, clinical (histocompatibility and immunogenetics). Therefore, there may be occasional mistakes despite careful proofreading. ?    Domenick Gong, MD 08/20/23 1053

## 2023-08-28 ENCOUNTER — Ambulatory Visit (INDEPENDENT_AMBULATORY_CARE_PROVIDER_SITE_OTHER)

## 2023-08-28 ENCOUNTER — Ambulatory Visit
Admission: RE | Admit: 2023-08-28 | Discharge: 2023-08-28 | Disposition: A | Discharge status: Home / Self Care | Source: Ambulatory Visit | Attending: Emergency Medicine | Admitting: Emergency Medicine

## 2023-08-28 VITALS — BP 124/86 | HR 101 | Temp 99.4°F | Resp 17

## 2023-08-28 DIAGNOSIS — J069 Acute upper respiratory infection, unspecified: Secondary | ICD-10-CM | POA: Diagnosis not present

## 2023-08-28 DIAGNOSIS — R052 Subacute cough: Secondary | ICD-10-CM | POA: Diagnosis not present

## 2023-08-28 MED ORDER — IPRATROPIUM BROMIDE 0.06 % NA SOLN: 2.0000 | Freq: Four times a day (QID) | NASAL | 12 refills | Status: AC

## 2023-08-28 MED ORDER — PROMETHAZINE-DM 6.25-15 MG/5ML PO SYRP: 5.0000 mL | ORAL_SOLUTION | Freq: Four times a day (QID) | ORAL | 0 refills | Status: AC | PRN

## 2023-08-28 MED ORDER — BENZONATATE 100 MG PO CAPS: 200.0000 mg | ORAL_CAPSULE | Freq: Three times a day (TID) | ORAL | 0 refills | Status: AC

## 2023-08-28 MED ORDER — AMOXICILLIN-POT CLAVULANATE 875-125 MG PO TABS: 1.0000 | ORAL_TABLET | Freq: Two times a day (BID) | ORAL | 0 refills | Status: AC

## 2023-08-28 NOTE — Discharge Instructions (Signed)
 Your chest x-ray did not demonstrate any definitive pneumonia.  Given that you have had symptoms for 11 days I do feel a trial of antibiotics is warranted.  Take the Augmentin 875 mg twice daily with food for 7 days for treatment of your upper respiratory infection.  Use over-the-counter Tylenol and/or ibuprofen according to pack instructions as needed for any fever or pain.  Use the Atrovent nasal spray, 2 squirts in each nostril every 6 hours, as needed for runny nose and postnasal drip.  Use the Tessalon Perles every 8 hours during the day.  Take them with a small sip of water.  They may give you some numbness to the base of your tongue or a metallic taste in your mouth, this is normal.  Use the Promethazine DM cough syrup at bedtime for cough and congestion.  It will make you drowsy so do not take it during the day.  Return for reevaluation or see your primary care provider for any new or worsening symptoms.

## 2023-08-28 NOTE — ED Triage Notes (Signed)
 Patient states that she is still having nasal congestion and cough. Turned productive yesterday.

## 2023-08-28 NOTE — ED Provider Notes (Addendum)
 [facility]    CSN: [id number] Arrival date & time: 08/28/23  0815      History   Chief Complaint Chief Complaint  Patient presents with   Cough    HPI Lindsey Warren is a 63 y.o. female.   HPI  63 year old female with past medical history significant for diabetes and hypertension presents for evaluation of ongoing respiratory symptoms which include nasal congestion and a cough.  She reports that yesterday the cough became productive for yellow sputum.  She denies any shortness of breath, wheezing, or fever.  She was seen in this urgent care 8 days ago after experiencing 3 days worth of symptoms.  She was COVID-negative at that time.  Past Medical History:  Diagnosis Date   Diabetes mellitus without complication (HCC)    Hypertension     Patient Active Problem List   Diagnosis Date Noted   Right knee pain 02/01/2020    Past Surgical History:  Procedure Laterality Date   COLONOSCOPY WITH PROPOFOL N/A 08/22/2019   Procedure: COLONOSCOPY WITH PROPOFOL;  Surgeon: Wyline Mood, MD;  Location: [facility] ENDOSCOPY;  Service: Gastroenterology;  Laterality: N/A;   FRACTURE SURGERY      OB History   No obstetric history on file.      Home Medications    Prior to Admission medications   Medication Sig Start Date End Date Taking? Authorizing Provider  amLODipine (NORVASC) 10 MG tablet take 1 tablet by mouth once daily 03/13/14  Yes [provider]  amoxicillin-clavulanate (AUGMENTIN) 875-125 MG tablet Take 1 tablet by mouth every 12 (twelve) hours for 7 days. 08/28/23 09/04/23 Yes Becky Augusta, NP  benzonatate (TESSALON) 100 MG capsule Take 2 capsules (200 mg total) by mouth every 8 (eight) hours. 08/28/23  Yes Becky Augusta, NP  hydrOXYzine (ATARAX) 25 MG tablet Take 25 mg by mouth daily.   Yes [provider]  ipratropium (ATROVENT) 0.06 % nasal spray Place 2 sprays into both nostrils 4 (four) times daily. 08/28/23  Yes Becky Augusta, NP   lisinopril-hydrochlorothiazide (PRINZIDE,ZESTORETIC) 20-25 MG per tablet Take by mouth. 09/14/14  Yes [provider]  losartan-hydrochlorothiazide (HYZAAR) 50-12.5 MG tablet Take 1 tablet by mouth daily.   Yes [provider]  MOUNJARO 2.5 MG/0.5ML Pen SMARTSIG:2.5 Milligram(s) SUB-Q Once a Week 01/23/23  Yes [provider]  promethazine-dextromethorphan (PROMETHAZINE-DM) 6.25-15 MG/5ML syrup Take 5 mLs by mouth 4 (four) times daily as needed. 08/28/23  Yes Becky Augusta, NP  fluticasone (FLONASE) 50 MCG/ACT nasal spray Place 2 sprays into both nostrils daily. 08/20/23   Domenick Gong, MD  ibuprofen (ADVIL) 600 MG tablet Take 1 tablet (600 mg total) by mouth every 6 (six) hours as needed. 01/29/20   Domenick Gong, MD  gabapentin (NEURONTIN) 300 MG capsule take 1 capsule by mouth at bedtime 06/10/14 01/29/20  [provider]    Family History Family History  Problem Relation Age of Onset   Hypertension Mother    Diabetes Mother    Diabetes Father    Hypertension Father     Social History Social History   Tobacco Use   Smoking status: Never   Smokeless tobacco: Never  Vaping Use   Vaping status: Never Used  Substance Use Topics   Alcohol use: Not Currently   Drug use: No     Allergies   Lisinopril   Review of Systems Review of Systems  Constitutional:  Negative for fever.  HENT:  Positive for congestion and rhinorrhea.   Respiratory:  Positive for cough. Negative for shortness of breath and wheezing.      Physical Exam Triage Vital Signs ED Triage Vitals  Encounter Vitals Group     BP      Systolic BP Percentile      Diastolic BP Percentile      Pulse      Resp      Temp      Temp src      SpO2      Weight      Height      Head Circumference      Peak Flow      Pain Score      Pain Loc      Pain Education      Exclude from Growth Chart    No data found.  Updated Vital Signs BP 124/86 (BP Location: Right Arm)    Pulse (!) 101   Temp 99.4 F (37.4 C) (Oral)   Resp 17   LMP  (LMP Unknown)   SpO2 97%   Visual Acuity Right Eye Distance:   Left Eye Distance:   Bilateral Distance:    Right Eye Near:   Left Eye Near:    Bilateral Near:     Physical Exam Vitals and nursing note reviewed.  Constitutional:      Appearance: Normal appearance. She is not ill-appearing.  HENT:     Nose: Congestion and rhinorrhea present.     Comments: Patient mucosa is markedly edematous and erythematous with scant clear discharge in both nares.    Mouth/Throat:     Mouth: Mucous membranes are moist.     Pharynx: Oropharynx is clear. No oropharyngeal exudate or posterior oropharyngeal erythema.  Cardiovascular:     Rate and Rhythm: Normal rate and regular rhythm.     Pulses: Normal pulses.     Heart sounds: Normal heart sounds. No murmur heard.    No friction rub. No gallop.  Pulmonary:     Effort: Pulmonary effort is normal.     Breath sounds: Normal breath sounds. No wheezing, rhonchi or rales.  Musculoskeletal:     Cervical back: Normal range of motion and neck supple. No tenderness.  Lymphadenopathy:     Cervical: No cervical adenopathy.  Skin:    General: Skin is warm and dry.     Capillary Refill: Capillary refill takes less than 2 seconds.     Findings: No rash.  Neurological:     General: No focal deficit present.     Mental Status: She is alert and oriented to person, place, and time.      UC Treatments / Results  Labs (all labs ordered are listed, but only abnormal results are displayed) Labs Reviewed - No data to display  EKG   Radiology No results found.  Procedures Procedures (including critical care time)  Medications Ordered in UC Medications - No data to display  Initial Impression / Assessment and Plan / UC Course  I have reviewed the triage vital signs and the nursing notes.  Pertinent labs & imaging results that were available during my care of the patient were  reviewed by me and considered in my medical decision making (see chart for details).   Patient is a nontoxic-appearing 63 year old female presenting for evaluation of ongoing and worsening respiratory symptoms as outlined HPI above.  Of greatest concern to her is that she has now developed a productive cough for yellow sputum.  She denies any fever or associated shortness  of breath or wheezing.  She does have a mildly elevated temp at triage of 99.4.  Respiratory rate was 17 and her oxygen saturation was 97%.  Her lungs are clear to auscultation.  She is able speak in full sentence without dyspnea or tachypnea.  Due to the fact that she has had continued respiratory symptoms and now has developed a productive cough I will obtain a chest x-ray to rule out any acute cardiopulmonary pathology.  Chest x-ray independent reviewed and evaluated by me.  Impression: There is a subtle patchiness to the right perihilar region as well as an elevation of the right hemidiaphragm.  Lung fields are otherwise well aerated.  Cardiomediastinal silhouette appears normal.  Radiology overread is pending. Radiology impression states no active cardiopulmonary disease.  I will discharge patient on the diagnosis of URI with cough and congestion.  I will start her on Augmentin 875 mg twice daily with food for 7 days.  Atrovent nasal spray to open nasal congestion.  Tessalon Perles and Promethazine DM cough syrup for cough and congestion.  Return precautions reviewed.     Final Clinical Impressions(s) / UC Diagnoses   Final diagnoses:  Subacute cough  URI with cough and congestion     Discharge Instructions      Your chest x-ray did not demonstrate any definitive pneumonia.  Given that you have had symptoms for 11 days I do feel a trial of antibiotics is warranted.  Take the Augmentin 875 mg twice daily with food for 7 days for treatment of your upper respiratory infection.  Use over-the-counter Tylenol and/or  ibuprofen according to pack instructions as needed for any fever or pain.  Use the Atrovent nasal spray, 2 squirts in each nostril every 6 hours, as needed for runny nose and postnasal drip.  Use the Tessalon Perles every 8 hours during the day.  Take them with a small sip of water.  They may give you some numbness to the base of your tongue or a metallic taste in your mouth, this is normal.  Use the Promethazine DM cough syrup at bedtime for cough and congestion.  It will make you drowsy so do not take it during the day.  Return for reevaluation or see your primary care provider for any new or worsening symptoms.      ED Prescriptions     Medication Sig Dispense Auth. Provider   amoxicillin-clavulanate (AUGMENTIN) 875-125 MG tablet Take 1 tablet by mouth every 12 (twelve) hours for 7 days. 14 tablet Becky Augusta, NP   benzonatate (TESSALON) 100 MG capsule Take 2 capsules (200 mg total) by mouth every 8 (eight) hours. 21 capsule Becky Augusta, NP   ipratropium (ATROVENT) 0.06 % nasal spray Place 2 sprays into both nostrils 4 (four) times daily. 15 mL Becky Augusta, NP   promethazine-dextromethorphan (PROMETHAZINE-DM) 6.25-15 MG/5ML syrup Take 5 mLs by mouth 4 (four) times daily as needed. 118 mL Becky Augusta, NP      PDMP not reviewed this encounter.   Becky Augusta, NP 08/28/23 1031    Becky Augusta, NP 08/28/23 [phone]
